# Patient Record
Sex: Female | Born: 1935 | Race: White | Hispanic: No | Marital: Married | Smoking: Former smoker
Health system: Southern US, Community
[De-identification: ages and names within clinical notes are randomized; demographics above are authoritative.]

## PROBLEM LIST (undated history)

## (undated) DIAGNOSIS — E785 Hyperlipidemia, unspecified: Secondary | ICD-10-CM

## (undated) DIAGNOSIS — I10 Essential (primary) hypertension: Secondary | ICD-10-CM

## (undated) HISTORY — PX: ABDOMINAL HYSTERECTOMY: SHX81

## (undated) HISTORY — PX: COLECTOMY: SHX59

## (undated) HISTORY — DX: Hyperlipidemia, unspecified: E78.5

## (undated) HISTORY — DX: Essential (primary) hypertension: I10

---

## 2003-11-12 ENCOUNTER — Other Ambulatory Visit: Payer: Self-pay

## 2004-11-11 ENCOUNTER — Ambulatory Visit: Payer: Self-pay | Admitting: Unknown Physician Specialty

## 2004-12-11 ENCOUNTER — Inpatient Hospital Stay: Payer: Self-pay | Admitting: Surgery

## 2004-12-16 ENCOUNTER — Inpatient Hospital Stay: Payer: Self-pay | Admitting: Surgery

## 2005-01-09 ENCOUNTER — Inpatient Hospital Stay: Payer: Self-pay | Admitting: Surgery

## 2005-02-14 ENCOUNTER — Ambulatory Visit: Payer: Self-pay | Admitting: Surgery

## 2005-02-26 ENCOUNTER — Encounter: Payer: Self-pay | Admitting: Unknown Physician Specialty

## 2006-09-02 ENCOUNTER — Ambulatory Visit: Payer: Self-pay | Admitting: Family Medicine

## 2006-09-28 ENCOUNTER — Ambulatory Visit: Payer: Self-pay | Admitting: Unknown Physician Specialty

## 2007-09-07 ENCOUNTER — Ambulatory Visit: Payer: Self-pay | Admitting: Family Medicine

## 2008-11-09 ENCOUNTER — Ambulatory Visit: Payer: Self-pay | Admitting: Family Medicine

## 2009-05-14 ENCOUNTER — Ambulatory Visit: Payer: Self-pay | Admitting: Family Medicine

## 2009-07-07 ENCOUNTER — Emergency Department: Payer: Self-pay | Admitting: Emergency Medicine

## 2009-11-22 ENCOUNTER — Ambulatory Visit: Payer: Self-pay | Admitting: Unknown Physician Specialty

## 2010-07-22 ENCOUNTER — Ambulatory Visit: Payer: Self-pay | Admitting: Internal Medicine

## 2010-12-31 LAB — HEPATIC FUNCTION PANEL
ALT: 14 U/L (ref 7–35)
Alkaline Phosphatase: 70 U/L (ref 25–125)
Bilirubin, Total: 0.4 mg/dL

## 2010-12-31 LAB — BASIC METABOLIC PANEL
BUN: 12 mg/dL (ref 4–21)
Glucose: 93 mg/dL
Sodium: 142 mmol/L (ref 137–147)

## 2010-12-31 LAB — CBC AND DIFFERENTIAL: Hemoglobin: 13.2 g/dL (ref 12.0–16.0)

## 2011-01-19 LAB — HM MAMMOGRAPHY: HM Mammogram: NORMAL

## 2011-05-07 ENCOUNTER — Encounter: Payer: Self-pay | Admitting: Internal Medicine

## 2011-05-07 DIAGNOSIS — E785 Hyperlipidemia, unspecified: Secondary | ICD-10-CM | POA: Insufficient documentation

## 2011-05-07 DIAGNOSIS — I10 Essential (primary) hypertension: Secondary | ICD-10-CM | POA: Insufficient documentation

## 2011-05-21 ENCOUNTER — Ambulatory Visit (INDEPENDENT_AMBULATORY_CARE_PROVIDER_SITE_OTHER): Payer: 59 | Admitting: Internal Medicine

## 2011-05-21 ENCOUNTER — Encounter: Payer: Self-pay | Admitting: Internal Medicine

## 2011-05-21 DIAGNOSIS — L219 Seborrheic dermatitis, unspecified: Secondary | ICD-10-CM

## 2011-05-21 DIAGNOSIS — E785 Hyperlipidemia, unspecified: Secondary | ICD-10-CM

## 2011-05-21 DIAGNOSIS — R300 Dysuria: Secondary | ICD-10-CM

## 2011-05-21 DIAGNOSIS — L218 Other seborrheic dermatitis: Secondary | ICD-10-CM

## 2011-05-21 DIAGNOSIS — B373 Candidiasis of vulva and vagina: Secondary | ICD-10-CM

## 2011-05-21 DIAGNOSIS — R32 Unspecified urinary incontinence: Secondary | ICD-10-CM | POA: Insufficient documentation

## 2011-05-21 DIAGNOSIS — B3731 Acute candidiasis of vulva and vagina: Secondary | ICD-10-CM

## 2011-05-21 LAB — POCT URINALYSIS DIPSTICK
Blood, UA: NEGATIVE
Ketones, UA: NEGATIVE
Protein, UA: NEGATIVE
Spec Grav, UA: 1.01
pH, UA: 6.5

## 2011-05-21 LAB — COMPREHENSIVE METABOLIC PANEL
Albumin: 4.6 g/dL (ref 3.5–5.2)
BUN: 20 mg/dL (ref 6–23)
Calcium: 9.2 mg/dL (ref 8.4–10.5)
Chloride: 104 mEq/L (ref 96–112)
GFR: 87.6 mL/min (ref >60.00)
Glucose, Bld: 124 mg/dL — ABNORMAL HIGH (ref 70–99)
Potassium: 3.8 mEq/L (ref 3.5–5.1)

## 2011-05-21 MED ORDER — FLUCONAZOLE 100 MG PO TABS: 150.0000 mg | ORAL_TABLET | Freq: Every day | ORAL | Status: AC

## 2011-05-21 MED ORDER — SALICYLIC ACID 3 % EX SHAM: 1.0000 "application " | MEDICATED_SHAMPOO | Freq: Every day | CUTANEOUS | Status: DC

## 2011-05-21 MED ORDER — NYSTATIN 100000 UNIT/GM EX POWD: CUTANEOUS | Status: DC

## 2011-05-21 NOTE — Patient Instructions (Signed)
    Seborrheic Dermatitis Seborrheic dermatitis involves pink or red skin with greasy, flaky scales. This is often found on the scalp, eyebrows, nose, bearded area, and behind or on the ears. It can also occur on the central chest. It often occurs where there are more oil (sebaceous) glands. This condition is also known as dandruff. When this condition affects a baby's scalp, it is called cradle cap. It may come and go for no known reason. It can occur in infancy or old age. CAUSES The cause is unknown. It is not the result of too little moisture or too much oil. In some people, seborrheic dermatitis flare-ups seem to be triggered by stress. It also commonly occurs in people with certain diseases such as Parkinson's disease or HIV/AIDS. SYMPTOMS  Thick scales on the scalp.   Redness on the face or in the armpits.   The skin may seem oily or dry, but moisturizers do not help.   In infants, seborrheic dermatitis appears as scaly redness that does not seem to bother the baby. In some babies, it affects only the scalp. In others, it also affects the neck creases, armpits, groin, or behind the ears.   In adults and adolescents, seborrheic dermatitis may affect only the scalp. It may look patchy or spread out, with areas of redness and flaking. Other areas commonly affected include:   Eyebrows.  Eyelids.   Forehead.   Skin behind the ears.   Outer ears.  Chest.   Armpits.   Nose creases.   Skin creases under the breasts.  Skin between the buttocks.   Groin.    Some adults and adolescents feel itching or burning in the affected areas. Others have no problems.  DIAGNOSIS Your caregiver can usually tell what the problem is by doing a physical exam. TREATMENT  Cortisone (steroid) ointments, creams, and lotions can help decrease inflammation.   Babies can be treated with baby oil to soften the scales, and then washed with baby shampoo. If this does not help, medicated shampoos  should work.   Adults can also use medicated shampoos.   Your caregiver may prescribe corticosteroid cream and shampoo containing an antifungal or yeast medicine (ketoconazole). Hydrocortisone or anti-yeast cream can be rubbed directly into seborrheic dermatitis patches. Yeast does not cause seborrheic dermatitis, but it seems to add to the problem.  In infants, seborrheic dermatitis is often worst during the first year of life. It tends to disappear on its own as the child grows. However, it may return during the teenage years. In adults and adolescents, seborrheic dermatitis tends to be a long-lasting condition that comes and goes over many years. HOME CARE INSTRUCTIONS  Use prescribed medicines as directed.   In infants, do not aggressively remove the scales or flakes on the scalp with a comb or other means. This may lead to hair loss.  SEEK MEDICAL CARE IF:  The problem does not improve from the medicated shampoos, lotions, or other medicines given by your caregiver.   You have any other questions or concerns.  Document Released: 09/08/2005 Document Re-Released: 02/26/2010 [employer] Patient Information 2011 [employer]. 

## 2011-05-21 NOTE — Progress Notes (Signed)
Subjective:    Patient ID: Chelsea Howard, female    DOB: [birth year], 77 y.o.   MRN: [id number]  [name] is a 77 year old female with a history of hypertension and hyperlipidemia who presents for followup. She has several concerns today. First she reports a rash over her scalp which has been present for several weeks. She describes this rash is crusting and itchy. She has not tried any medications for this. She denies any fever, chills, or pain over her scalp.  She also reports recent leakage of urine. She describes this as a constant dripping of urine rather than urge incontinence. She reports that she wears a panty liner or thicker pad but her clothes are often soaked with urine. She reports the urine your takes her skin and she has a rash over her groin area which is extremely itchy. She reports that Dr. Tiburcio Pea had given her a cream to help with this last year with minimal improvement.  She also reports some areas of bulging in her abdomen. She notes she has had several abdominal surgeries. One of her abdominal surgeries involved repair of the large ventral wall hernia with mesh. She now notes some bulging in the area where the mesh was placed. She denies any abdominal pain, discoloration of the abdomen, nausea, vomiting, constipation, or diarrhea.  [name] also presents to followup hypertension. Her hypertension has been ongoing for years and has been previously well controlled. She currently uses quinapril. She has a history of urine positive for microalbumin. Her most recent urine microalbumin however was negative in July of 2012. She did not bring a record of her blood pressure today. She denies chest pain, shortness of breath, palpitations, or lower chimney edema.  Hyperlipidemia This is a chronic problem. The current episode started more than 1 year ago. The problem is controlled. Recent lipid tests were reviewed and are normal (11914). Exacerbating diseases include obesity. She has  no history of chronic renal disease, diabetes, hypothyroidism or liver disease. Factors aggravating her hyperlipidemia include no known factors. Pertinent negatives include no chest pain, leg pain, myalgias or shortness of breath. Current antihyperlipidemic treatment includes statins. The current treatment provides significant improvement of lipids. There are no compliance problems.  Risk factors for coronary artery disease include a sedentary lifestyle, obesity and dyslipidemia.     Outpatient Prescriptions Prior to Visit  Medication Sig Dispense Refill  . aspirin 81 MG tablet Take 81 mg by mouth daily.        . Calcium Carbonate-Vitamin D (CALCIUM + D) 600-200 MG-UNIT TABS Take by mouth.        . quinapril (ACCUPRIL) 10 MG tablet Take 10 mg by mouth 2 (two) times daily.        . simvastatin (ZOCOR) 20 MG tablet Take 20 mg by mouth daily.           Review of Systems  Constitutional: Negative for fever, chills, appetite change, fatigue and unexpected weight change.  HENT: Negative for ear pain, congestion, sore throat, trouble swallowing, neck pain, voice change and sinus pressure.   Eyes: Negative for visual disturbance.  Respiratory: Negative for cough, shortness of breath, wheezing and stridor.   Cardiovascular: Negative for chest pain, palpitations and leg swelling.  Gastrointestinal: Positive for abdominal distention. Negative for nausea, vomiting, abdominal pain, diarrhea, constipation, blood in stool and anal bleeding.  Genitourinary: Positive for frequency (and incontinence). Negative for dysuria and flank pain.  Musculoskeletal: Negative for myalgias, arthralgias and gait problem.  Skin: Positive  for rash (over scalp). Negative for color change.  Neurological: Negative for dizziness and headaches.  Hematological: Negative for adenopathy. Does not bruise/bleed easily.  Psychiatric/Behavioral: Negative for suicidal ideas, sleep disturbance and dysphoric mood. The patient is not  nervous/anxious.      BP 145/79  Pulse 83  Temp(Src) 97.9 F (36.6 C) (Oral)  Resp 16  Ht 5\' 2"  (1.575 m)  Wt 194 lb 12 oz (88.338 kg)  BMI 35.62 kg/m2  SpO2 95%     Objective:   Physical Exam  Constitutional: She is oriented to person, place, and time. She appears well-developed and well-nourished. No distress.  HENT:  Head: Normocephalic and atraumatic.  Right Ear: External ear normal.  Left Ear: External ear normal.  Nose: Nose normal.  Mouth/Throat: Oropharynx is clear and moist. No oropharyngeal exudate.  Eyes: Conjunctivae are normal. Pupils are equal, round, and reactive to light. Right eye exhibits no discharge. Left eye exhibits no discharge. No scleral icterus.  Neck: Normal range of motion. Neck supple. No tracheal deviation present. No thyromegaly present.  Cardiovascular: Normal rate, regular rhythm, normal heart sounds and intact distal pulses.  Exam reveals no gallop and no friction rub.   No murmur heard. Pulmonary/Chest: Effort normal and breath sounds normal. No respiratory distress. She has no wheezes. She has no rales. She exhibits no tenderness.  Abdominal: Soft. Normal appearance and bowel sounds are normal. She exhibits no distension. There is no hepatosplenomegaly. There is no tenderness. A hernia is present. Hernia confirmed positive in the ventral area.         Numerous healed scars over patients abdomen, with several areas of palpable ventral wall defects which are reducible  Genitourinary:    Pelvic exam was performed with patient prone. There is rash on the right labia. There is no tenderness or lesion on the right labia. There is rash on the left labia. There is no tenderness or lesion on the left labia. There is erythema around the vagina. No tenderness around the vagina. No signs of injury around the vagina. No vaginal discharge found.       Left labia enlarged and edematous, diffuse erythematous rash over groin and vulva  Musculoskeletal: Normal  range of motion. She exhibits no edema and no tenderness.  Lymphadenopathy:    She has no cervical adenopathy.  Neurological: She is alert and oriented to person, place, and time. No cranial nerve deficit. She exhibits normal muscle tone. Coordination normal.  Skin: Skin is warm and dry. Rash noted. Rash is papular. She is not diaphoretic. No erythema. No pallor.       Scaling erythematous papular rash over scalp  Psychiatric: She has a normal mood and affect. Her behavior is normal. Judgment and thought content normal.          Assessment & Plan:  1. Urinary incontinence - patient with urinary incontinence. Her exam is remarkable for vulvar edema and vaginal prolapse. Suspect that this is leading to leakage of urine and she may need either pessary or surgical repair. She also complained of dysuria today, however urine dip performed in clinic was negative. Will send urine for culture. Will set her up with the urogynecology at [facility]. She will return to clinic here in one month.   2. Seborrheic Dermatitis -  patient with rash over scalp which is consistent with seborrheic dermatitis. Will try a course of Selsun shampoo. She will apply the shampoo daily for 20 minutes for 7 day course. If she has no  improvement we'll set her up with dermatology for evaluation.   3. Vaginal Candidiasis - patient with vaginal candidiasis. Will treat with Diflucan. Will also use Nystop to help prevent recurrent infection in the vaginal area and groin. Suspect that his problem is recurrent secondary to ongoing moisture from urinary leakage.  4. Hypertension -  patient with long history of hypertension. Blood pressure slightly elevated today. Recent urine microalbumin was negative. Will repeat renal function with labs today. She will followup in one month. His blood pressure consistently elevated above goal of 120/80 we'll consider increasing dose of quinapril.   5. Hyperlipidemia -  patient with history of  hyperlipidemia. Currently on simvastatin. Will check liver function test with labs today. She had recent lipid profile in April 2012 which was excellent.

## 2011-05-23 LAB — URINE CULTURE: Colony Count: 60000

## 2011-05-29 ENCOUNTER — Telehealth (INDEPENDENT_AMBULATORY_CARE_PROVIDER_SITE_OTHER): Payer: 59 | Admitting: *Deleted

## 2011-05-29 ENCOUNTER — Other Ambulatory Visit (INDEPENDENT_AMBULATORY_CARE_PROVIDER_SITE_OTHER): Payer: 59 | Admitting: *Deleted

## 2011-05-29 DIAGNOSIS — N390 Urinary tract infection, site not specified: Secondary | ICD-10-CM

## 2011-05-29 LAB — POCT URINALYSIS DIPSTICK
Bilirubin, UA: NEGATIVE
pH, UA: 6.5

## 2011-05-29 NOTE — Telephone Encounter (Signed)
Patient came in office this morning to get her lab results. I printed out a copy for her. Also I asked her if she has been having any symptoms since the second culture showed multiple growth as well. She said that she has been having a lot of leakage and a lot of burning, and also some itching. She left another specimen. The UA did show some blood ( I put the results in) , so I sent out another culture. She is also asking if you would refer her to a gynecologist.

## 2011-05-29 NOTE — Telephone Encounter (Signed)
We should set her up with urogynecology at [facility].

## 2011-05-29 NOTE — Telephone Encounter (Signed)
Routed note to shannon to setup with urogynecology at [facility].

## 2011-05-29 NOTE — Telephone Encounter (Signed)
Yes, she says that it is for the incontinence.

## 2011-05-29 NOTE — Telephone Encounter (Signed)
Sounds good. I am happy to refer her to a gynecologist.  Just need to know reason (I suspect urinary incontinence).

## 2011-05-31 LAB — URINE CULTURE

## 2011-06-02 ENCOUNTER — Other Ambulatory Visit: Payer: Self-pay | Admitting: *Deleted

## 2011-06-02 MED ORDER — CIPROFLOXACIN HCL 500 MG PO TABS: 500.0000 mg | ORAL_TABLET | Freq: Two times a day (BID) | ORAL | Status: AC

## 2011-06-06 ENCOUNTER — Encounter: Payer: Self-pay | Admitting: Internal Medicine

## 2011-06-23 ENCOUNTER — Ambulatory Visit (INDEPENDENT_AMBULATORY_CARE_PROVIDER_SITE_OTHER): Payer: 59 | Admitting: Internal Medicine

## 2011-06-23 ENCOUNTER — Encounter: Payer: Self-pay | Admitting: Internal Medicine

## 2011-06-23 VITALS — BP 161/83 | HR 84 | Temp 98.3°F | Resp 16 | Ht 62.0 in | Wt 200.5 lb

## 2011-06-23 DIAGNOSIS — N390 Urinary tract infection, site not specified: Secondary | ICD-10-CM

## 2011-06-23 DIAGNOSIS — R32 Unspecified urinary incontinence: Secondary | ICD-10-CM

## 2011-06-23 DIAGNOSIS — I10 Essential (primary) hypertension: Secondary | ICD-10-CM

## 2011-06-23 LAB — POCT URINALYSIS DIPSTICK
Bilirubin, UA: NEGATIVE
Glucose, UA: NEGATIVE
Ketones, UA: NEGATIVE
Nitrite, UA: NEGATIVE
Protein, UA: NEGATIVE
Spec Grav, UA: 1.01
Urobilinogen, UA: 0.2
pH, UA: 7

## 2011-06-23 NOTE — Patient Instructions (Addendum)
We will set up referral to urogynecology. Follow up in 1 month.

## 2011-06-23 NOTE — Progress Notes (Signed)
Subjective:    Patient ID: Chelsea Howard, female    DOB: 01-Oct-1931, 77 y.o.   MRN: [id number]  HPI Chelsea Howard is a 77 year old female with a recent history of urinary incontinence who presents for followup. At her last visit she reported urinary incontinence with some rectal leakage. Her exam was remarkable for erythema over her labia. We'll plan to set her up with urogynecology for evaluation. However, this referral has not taken place. She reports persistent urinary leakage. She was treated with a 14 day course of Cipro for urinary tract infection, however reports no improvement with this. She denies any fever or chills. She denies any flank pain or hematuria.    Outpatient Encounter Prescriptions as of 06/23/2011  Medication Sig Dispense Refill  . aspirin 81 MG tablet Take 81 mg by mouth daily.        . Calcium Carbonate-Vitamin D (CALCIUM + D) 600-200 MG-UNIT TABS Take by mouth.        . nystatin (NYSTOP) 100000 UNIT/GM POWD 1 application twice daily  30 g  0  . quinapril (ACCUPRIL) 10 MG tablet Take 10 mg by mouth 2 (two) times daily.        . Salicylic Acid 3 % SHAM Apply 1 application topically daily.  1 Bottle  0  . simvastatin (ZOCOR) 20 MG tablet Take 20 mg by mouth daily.        . tacrolimus (PROTOPIC) 0.1 % ointment Apply topically as needed.        . triamcinolone (KENALOG) 0.1 % ointment       . zinc oxide (BALMEX) 11.3 % CREA cream Apply topically as needed.          Review of Systems  Constitutional: Negative for fever, chills, diaphoresis and fatigue.  Gastrointestinal: Negative for abdominal pain and blood in stool.  Genitourinary: Positive for dysuria, urgency, frequency and vaginal pain.  Skin: Positive for rash.   BP 161/83  Pulse 84  Temp(Src) 98.3 F (36.8 C) (Oral)  Resp 16  Ht 5\' 2"  (1.575 m)  Wt 200 lb 8 oz (90.946 kg)  BMI 36.67 kg/m2  SpO2 96%     Objective:   Physical Exam  Constitutional: She is oriented to person, place, and time. She  appears well-developed and well-nourished. No distress.  HENT:  Head: Normocephalic and atraumatic.  Right Ear: External ear normal.  Left Ear: External ear normal.  Nose: Nose normal.  Mouth/Throat: Oropharynx is clear and moist. No oropharyngeal exudate.  Eyes: Conjunctivae are normal. Pupils are equal, round, and reactive to light. Right eye exhibits no discharge. Left eye exhibits no discharge. No scleral icterus.  Neck: Normal range of motion. Neck supple. No tracheal deviation present. No thyromegaly present.  Cardiovascular: Normal rate, regular rhythm, normal heart sounds and intact distal pulses.  Exam reveals no gallop and no friction rub.   No murmur heard. Pulmonary/Chest: Effort normal and breath sounds normal. No respiratory distress. She has no wheezes. She has no rales. She exhibits no tenderness.  Musculoskeletal: Normal range of motion. She exhibits no edema and no tenderness.  Lymphadenopathy:    She has no cervical adenopathy.  Neurological: She is alert and oriented to person, place, and time. No cranial nerve deficit. She exhibits normal muscle tone. Coordination normal.  Skin: Skin is warm and dry. No rash noted. She is not diaphoretic. No erythema. No pallor.  Psychiatric: She has a normal mood and affect. Her behavior is normal. Judgment and thought content normal.  Assessment & Plan:  1. Urinary incontinence with rectal leakage - referral made to [facility] urogynecology today. Question if she may have a fistula between her rectum and bladder given persistent leakage of urine through rectum. Her last urine culture showed several different bacteria and was repeated with the same result. She was treated with a two-week course of Cipro without improvement. Her urine today show signs of inflammation. We will send her urine for culture. She will followup in one month after she is seen by urogynecology.  2. Hypertension - BP elevated today. On quinapril. Recent renal  function was normal on recent labs.Recheck 1 month.

## 2011-06-25 LAB — URINE CULTURE: Colony Count: >=100000

## 2011-06-30 ENCOUNTER — Telehealth: Payer: Self-pay | Admitting: *Deleted

## 2011-06-30 NOTE — Telephone Encounter (Signed)
Left message for patient to return my call.

## 2011-06-30 NOTE — Telephone Encounter (Signed)
Patient notified

## 2011-06-30 NOTE — Telephone Encounter (Signed)
I think we should leave her off antibiotics, because the bacteria that showed on culture is typically a colonizing bacteria.  Unless she has progressive symptoms, worsening pain, fever, etc.

## 2011-06-30 NOTE — Telephone Encounter (Signed)
Patient says that she now got her appt with [facility]. It is scheduled for 07-16-11. Also she is asking if you would still want her to repeat antibiotic. If so what do I need to call in. Please advise.

## 2011-07-28 ENCOUNTER — Ambulatory Visit (INDEPENDENT_AMBULATORY_CARE_PROVIDER_SITE_OTHER): Payer: 59 | Admitting: Internal Medicine

## 2011-07-28 ENCOUNTER — Encounter: Payer: Self-pay | Admitting: Internal Medicine

## 2011-07-28 DIAGNOSIS — I10 Essential (primary) hypertension: Secondary | ICD-10-CM

## 2011-07-28 DIAGNOSIS — B373 Candidiasis of vulva and vagina: Secondary | ICD-10-CM

## 2011-07-28 NOTE — Progress Notes (Signed)
Subjective:    Patient ID: Chelsea Howard, female    DOB: [birth year], 77 y.o.   MRN: [id number]  HPI 77 year old female with a history of hypertension and urinary issues who presents for followup. Patient reports that she was seen by urogynecology. She notes that she was started on a topical triamcinolone and nystatin cream. She was also instructed to stop using panty liners because there was concern about allergy to the adhesive and knees. She reports significant improvement in her symptoms of vaginal burning or itching. She reports no ongoing urinary leakage at this point.  In regards to her hypertension, she reports full compliance with her medications. She has not been regularly checking her blood pressure.  Outpatient Encounter Prescriptions as of 07/28/2011  Medication Sig Dispense Refill  . aspirin 81 MG tablet Take 81 mg by mouth daily.        . Calcium Carbonate-Vitamin D (CALCIUM + D) 600-200 MG-UNIT TABS Take by mouth.        . nystatin-triamcinolone (MYCOLOG) ointment Apply topically 2 (two) times daily.        . quinapril (ACCUPRIL) 10 MG tablet Take 10 mg by mouth 2 (two) times daily.        . simvastatin (ZOCOR) 20 MG tablet Take 20 mg by mouth daily.          Review of Systems  Constitutional: Negative for fever, chills, appetite change, fatigue and unexpected weight change.  HENT: Negative for ear pain, congestion, sore throat, trouble swallowing, neck pain, voice change and sinus pressure.   Eyes: Negative for visual disturbance.  Respiratory: Negative for cough, shortness of breath, wheezing and stridor.   Cardiovascular: Negative for chest pain, palpitations and leg swelling.  Gastrointestinal: Negative for nausea, vomiting, abdominal pain, diarrhea, constipation, blood in stool, abdominal distention and anal bleeding.  Genitourinary: Negative for dysuria, urgency, frequency, flank pain, vaginal discharge, vaginal pain and pelvic pain.  Musculoskeletal: Negative for  myalgias, arthralgias and gait problem.  Skin: Negative for color change and rash.  Neurological: Negative for dizziness and headaches.  Hematological: Negative for adenopathy. Does not bruise/bleed easily.  Psychiatric/Behavioral: Negative for suicidal ideas, sleep disturbance and dysphoric mood. The patient is not nervous/anxious.    BP 150/80  Pulse 86  Temp(Src) 97.7 F (36.5 C) (Oral)  Wt 196 lb (88.905 kg)  SpO2 96%     Objective:   Physical Exam  Constitutional: She is oriented to person, place, and time. She appears well-developed and well-nourished. No distress.  HENT:  Head: Normocephalic and atraumatic.  Right Ear: External ear normal.  Left Ear: External ear normal.  Nose: Nose normal.  Mouth/Throat: Oropharynx is clear and moist. No oropharyngeal exudate.  Eyes: Conjunctivae are normal. Pupils are equal, round, and reactive to light. Right eye exhibits no discharge. Left eye exhibits no discharge. No scleral icterus.  Neck: Normal range of motion. Neck supple. No tracheal deviation present. No thyromegaly present.  Cardiovascular: Normal rate, regular rhythm, normal heart sounds and intact distal pulses.  Exam reveals no gallop and no friction rub.   No murmur heard. Pulmonary/Chest: Effort normal and breath sounds normal. No respiratory distress. She has no wheezes. She has no rales. She exhibits no tenderness.  Musculoskeletal: Normal range of motion. She exhibits no edema and no tenderness.  Lymphadenopathy:    She has no cervical adenopathy.  Neurological: She is alert and oriented to person, place, and time. No cranial nerve deficit. She exhibits normal muscle tone. Coordination normal.  Skin:  Skin is warm and dry. No rash noted. She is not diaphoretic. No erythema. No pallor.  Psychiatric: She has a normal mood and affect. Her behavior is normal. Judgment and thought content normal.          Assessment & Plan:  1. Vaginitis -symptoms markedly improved with  triamcinolone and nystatin. We'll continue to monitor. We'll request records from her urogynecology evaluation.  2. Hypertension -blood pressure is slightly elevated today. Patient will check blood pressure at home 1-2 times per week. She will call if blood pressure consistently greater than 140/90. Otherwise, will plan have her followup in April 2013.

## 2011-08-07 ENCOUNTER — Ambulatory Visit: Payer: Self-pay | Admitting: Internal Medicine

## 2011-08-27 ENCOUNTER — Encounter: Payer: Self-pay | Admitting: Internal Medicine

## 2012-01-19 ENCOUNTER — Ambulatory Visit (INDEPENDENT_AMBULATORY_CARE_PROVIDER_SITE_OTHER): Payer: 59 | Admitting: Internal Medicine

## 2012-01-19 ENCOUNTER — Encounter: Payer: Self-pay | Admitting: Internal Medicine

## 2012-01-19 VITALS — BP 138/78 | HR 80 | Temp 97.6°F | Resp 16 | Ht 62.75 in | Wt 194.8 lb

## 2012-01-19 DIAGNOSIS — E785 Hyperlipidemia, unspecified: Secondary | ICD-10-CM | POA: Insufficient documentation

## 2012-01-19 DIAGNOSIS — I10 Essential (primary) hypertension: Secondary | ICD-10-CM

## 2012-01-19 DIAGNOSIS — Z0000 Encounter for general adult medical examination without abnormal findings: Secondary | ICD-10-CM

## 2012-01-19 LAB — COMPREHENSIVE METABOLIC PANEL
Albumin: 4.2 g/dL (ref 3.5–5.2)
Alkaline Phosphatase: 65 U/L (ref 39–117)
BUN: 9 mg/dL (ref 6–23)
Calcium: 9.1 mg/dL (ref 8.4–10.5)
Chloride: 103 mEq/L (ref 96–112)
Glucose, Bld: 94 mg/dL (ref 70–99)
Potassium: 3.7 mEq/L (ref 3.5–5.1)

## 2012-01-19 LAB — LIPID PANEL
Cholesterol: 145 mg/dL (ref 0–200)
Triglycerides: 183 mg/dL — ABNORMAL HIGH (ref 0.0–149.0)

## 2012-01-19 MED ORDER — QUINAPRIL HCL 10 MG PO TABS: 10.0000 mg | ORAL_TABLET | Freq: Two times a day (BID) | ORAL | Status: DC

## 2012-01-19 MED ORDER — SIMVASTATIN 20 MG PO TABS: 20.0000 mg | ORAL_TABLET | Freq: Every day | ORAL | Status: DC

## 2012-01-19 NOTE — Assessment & Plan Note (Signed)
BP well controlled. Continue Quinapril. Will check renal function with labs today. Follow up 6 months.

## 2012-01-19 NOTE — Assessment & Plan Note (Signed)
Will repeat CMP and lipids today. Goal LDL<100. Continue Simvastatin. Follow up 6 months.

## 2012-01-19 NOTE — Assessment & Plan Note (Signed)
Health maintenance is UTD.  Continue current medications. Encouraged continued efforts at healthy diet and exercise. Follow up 6 months.

## 2012-01-19 NOTE — Progress Notes (Signed)
Patient ID: Chelsea Howard, female   DOB: [birth year], 78 y.o.   MRN: [id number] Subjective:    Chelsea Howard is a 78 y.o. female who presents for Medicare Annual/Subsequent preventive examination. She is doing well and has no concerns today.  Preventive Screening-Counseling & Management  Tobacco History  Smoking status  . Former Smoker   Smokeless tobacco  . Never Used       Current Problems (verified) Patient Active Problem List  Diagnoses  . Hyperlipidemia  . Hypertension  . Urinary incontinence    Medications Prior to Visit Current Outpatient Prescriptions on File Prior to Visit  Medication Sig Dispense Refill  . aspirin 81 MG tablet Take 81 mg by mouth daily.        . Calcium Carbonate-Vitamin D (CALCIUM + D) 600-200 MG-UNIT TABS Take by mouth.        . nystatin-triamcinolone (MYCOLOG) ointment Apply topically 2 (two) times daily.        . quinapril (ACCUPRIL) 10 MG tablet Take 10 mg by mouth 2 (two) times daily.        . simvastatin (ZOCOR) 20 MG tablet Take 20 mg by mouth daily.          Current Medications (verified) Current Outpatient Prescriptions  Medication Sig Dispense Refill  . aspirin 81 MG tablet Take 81 mg by mouth daily.        . Calcium Carbonate-Vitamin D (CALCIUM + D) 600-200 MG-UNIT TABS Take by mouth.        . NON FORMULARY Take 1 each by mouth daily. Yeast Guard.      . nystatin-triamcinolone (MYCOLOG) ointment Apply topically 2 (two) times daily.        . quinapril (ACCUPRIL) 10 MG tablet Take 10 mg by mouth 2 (two) times daily.        . simvastatin (ZOCOR) 20 MG tablet Take 20 mg by mouth daily.           Allergies (verified) Review of patient's allergies indicates no known allergies.   PAST HISTORY  Family History Father - Heart disease Mother - Hypertension Brother - Diabetes  Social History History  Substance Use Topics  . Smoking status: Former Games developer   . Smokeless tobacco: Never Used  . Alcohol Use: No     Are there  smokers in your home (other than you)? No  Risk Factors Current exercise habits: Home exercise routine includes walking.  Dietary issues discussed: Healthy diet   Cardiac risk factors: advanced age (older than 76 for men, 87 for women).  Depression Screen (Note: if answer to either of the following is "Yes", a more complete depression screening is indicated)   Over the past two weeks, have you felt down, depressed or hopeless? No  Over the past two weeks, have you felt little interest or pleasure in doing things? No  Have you lost interest or pleasure in daily life? No  Do you often feel hopeless? No  Do you cry easily over simple problems? No  Activities of Daily Living In your present state of health, do you have any difficulty performing the following activities?:  Driving? No Managing money?  No Feeding yourself? No Getting from bed to chair? No Climbing a flight of stairs? No Preparing food and eating?: No Bathing or showering? No Getting dressed: No Getting to the toilet? No Using the toilet:No Moving around from place to place: No In the past year have you fallen or had a near fall?:No  Are you sexually active?  Yes  Do you have more than one partner?  No  Hearing Difficulties: No Do you often ask people to speak up or repeat themselves? No Do you experience ringing or noises in your ears? No Do you have difficulty understanding soft or whispered voices? No   Do you feel that you have a problem with memory? No  Do you often misplace items? No  Do you feel safe at home?  Yes  Cognitive Testing  Alert? Yes  Normal Appearance?Yes  Oriented to person? Yes  Place? Yes   Time? Yes  Recall of three objects?  Yes  Can perform simple calculations? Yes  Displays appropriate judgment?Yes  Can read the correct time from a watch face?Yes   Advanced Directives have been discussed with the patient? Yes, HCPOA is husband  List the Names of Other Physician/Practitioners  you currently use: 1.  Dr. Donnetta Hutching - optho    There is no immunization history on file for this patient.  Screening Tests Health Maintenance  Topic Date Due  . Tetanus/tdap  01/04/1953  . Colonoscopy  01/05/1984  . Zostavax  01/04/1994  . Pneumococcal Polysaccharide Vaccine Age 43 And Over  01/05/1999  . Influenza Vaccine  06/22/2012    All answers were reviewed with the patient and necessary referrals were made:  Shelia Media, MD   01/19/2012   History reviewed: allergies, current medications, past family history, past medical history, past social history, past surgical history and problem list  Review of Systems A comprehensive review of systems was negative.    Objective:      Body mass index is 34.77 kg/(m^2). BP 138/78  Pulse 80  Temp(Src) 97.6 F (36.4 C) (Oral)  Resp 16  Ht 5' 2.75" (1.594 m)  Wt 194 lb 12 oz (88.338 kg)  BMI 34.77 kg/m2   General Appearance:    Alert, cooperative, no distress, appears stated age  Head:    Normocephalic, without obvious abnormality, atraumatic  Eyes:    PERRL, conjunctiva/corneas clear, EOM's intact, fundi    benign, both eyes  Ears:    Normal TM's and external ear canals, both ears  Nose:   Nares normal, septum midline, mucosa normal, no drainage    or sinus tenderness  Throat:   Lips, mucosa, and tongue normal; teeth and gums normal  Neck:   Supple, symmetrical, trachea midline, no adenopathy;    thyroid:  no enlargement/tenderness/nodules; no carotid   bruit or JVD  Back:     Symmetric, no curvature, ROM normal, no CVA tenderness  Lungs:     Clear to auscultation bilaterally, respirations unlabored  Chest Wall:    No tenderness or deformity   Heart:    Regular rate and rhythm, S1 and S2 normal, no murmur, rub   or gallop  Breast Exam:    No tenderness, masses, or nipple abnormality  Abdomen:     Soft, non-tender, bowel sounds active all four quadrants,    no masses, no organomegaly  Extremities:   Extremities normal,  atraumatic, no cyanosis or edema  Pulses:   2+ and symmetric all extremities  Skin:   Skin color, texture, turgor normal, no rashes or lesions  Lymph nodes:   Cervical, supraclavicular, and axillary nodes normal  Neurologic:   CNII-XII intact, normal strength, sensation and reflexes    throughout       Assessment:     78YO female  presents for annual wellness exam. She is doing very well.  Health maintenance is up to date with the exception of tetanus vaccine, which is not covered by Medicare so she will defer for now.  She is followed by opthalmology, so vision screen deferred.     Plan:     Continue current medications. Continue with efforts at healthy diet and regular physical activity with goal of of aerobic exercise most days of week. Patient Instructions (the written plan) was given to the patient.  Medicare Attestation I have personally reviewed: The patient's medical and social history Their use of alcohol, tobacco or illicit drugs Their current medications and supplements The patient's functional ability including ADLs,fall risks, home safety risks, cognitive, and hearing and visual impairment Diet and physical activities Evidence for depression or mood disorders  The patient's weight, height, BMI, and visual acuity have been recorded in the chart.  I have made referrals, counseling, and provided education to the patient based on review of the above and I have provided the patient with a written personalized care plan for preventive services.     Shelia Media, MD   01/19/2012

## 2012-03-02 ENCOUNTER — Other Ambulatory Visit: Payer: Self-pay | Admitting: *Deleted

## 2012-03-02 DIAGNOSIS — I10 Essential (primary) hypertension: Secondary | ICD-10-CM

## 2012-03-02 MED ORDER — QUINAPRIL HCL 10 MG PO TABS: 10.0000 mg | ORAL_TABLET | Freq: Two times a day (BID) | ORAL | Status: DC

## 2012-07-20 ENCOUNTER — Ambulatory Visit: Payer: 59 | Admitting: Internal Medicine

## 2012-07-27 ENCOUNTER — Encounter: Payer: Self-pay | Admitting: Internal Medicine

## 2012-07-27 ENCOUNTER — Ambulatory Visit (INDEPENDENT_AMBULATORY_CARE_PROVIDER_SITE_OTHER): Payer: 59 | Admitting: Internal Medicine

## 2012-07-27 VITALS — BP 160/90 | HR 80 | Temp 98.1°F | Ht 62.75 in | Wt 189.5 lb

## 2012-07-27 DIAGNOSIS — Z1239 Encounter for other screening for malignant neoplasm of breast: Secondary | ICD-10-CM

## 2012-07-27 DIAGNOSIS — I10 Essential (primary) hypertension: Secondary | ICD-10-CM

## 2012-07-27 DIAGNOSIS — R413 Other amnesia: Secondary | ICD-10-CM

## 2012-07-27 DIAGNOSIS — E785 Hyperlipidemia, unspecified: Secondary | ICD-10-CM

## 2012-07-27 MED ORDER — SIMVASTATIN 20 MG PO TABS: 20.0000 mg | ORAL_TABLET | Freq: Every day | ORAL | Status: DC

## 2012-07-27 MED ORDER — QUINAPRIL HCL 10 MG PO TABS: 10.0000 mg | ORAL_TABLET | Freq: Two times a day (BID) | ORAL | Status: DC

## 2012-07-27 NOTE — Progress Notes (Signed)
Subjective:    Patient ID: Chelsea Howard, female    DOB: April 13, 1932, 78 y.o.   MRN: [id number]  HPI 78 year old female with history of hypertension and hyperlipidemia presents for followup. Her primary concern today is gradually memory loss over the last several months. She reports that she finds herself to be more forgetful, often forgetting why she has walked into a room or why she is performing a task. She denies any difficulty with driving, or managing basic finances. She has heard about medications to help with memory and is wondering if these might be helpful for her.  In regards to blood pressure, she reports blood pressures been well controlled with quinapril. She denies any chest pain, palpitations, headache. She reports full compliance with her medication. She also reports full compliance with cholesterol medication. She denies any side effects from this medicine.   Outpatient Encounter Prescriptions as of 07/27/2012  Medication Sig Dispense Refill  . aspirin 81 MG tablet Take 81 mg by mouth daily.        . Calcium Carbonate-Vitamin D (CALCIUM + D) 600-200 MG-UNIT TABS Take by mouth.        . NON FORMULARY Take 1 each by mouth daily. Yeast Guard.      . nystatin-triamcinolone (MYCOLOG) ointment Apply topically 2 (two) times daily.        . quinapril (ACCUPRIL) 10 MG tablet Take 1 tablet (10 mg total) by mouth 2 (two) times daily.  60 tablet  11  . simvastatin (ZOCOR) 20 MG tablet Take 1 tablet (20 mg total) by mouth daily.  30 tablet  11   BP 160/90  Pulse 80  Temp 98.1 F (36.7 C) (Oral)  Ht 5' 2.75" (1.594 m)  Wt 189 lb 8 oz (85.957 kg)  BMI 33.84 kg/m2  SpO2 97%  Review of Systems  Constitutional: Negative for fever, chills, appetite change, fatigue and unexpected weight change.  HENT: Negative for ear pain, congestion, sore throat, trouble swallowing, neck pain, voice change and sinus pressure.   Eyes: Negative for visual disturbance.  Respiratory: Negative for cough,  shortness of breath, wheezing and stridor.   Cardiovascular: Negative for chest pain, palpitations and leg swelling.  Gastrointestinal: Negative for nausea, vomiting, abdominal pain, diarrhea, constipation, blood in stool, abdominal distention and anal bleeding.  Genitourinary: Negative for dysuria and flank pain.  Musculoskeletal: Negative for myalgias, arthralgias and gait problem.  Skin: Negative for color change and rash.  Neurological: Negative for dizziness and headaches.  Hematological: Negative for adenopathy. Does not bruise/bleed easily.  Psychiatric/Behavioral: Positive for confusion and decreased concentration. Negative for suicidal ideas, sleep disturbance and dysphoric mood. The patient is not nervous/anxious.        Objective:   Physical Exam  Constitutional: She is oriented to person, place, and time. She appears well-developed and well-nourished. No distress.  HENT:  Head: Normocephalic and atraumatic.  Right Ear: External ear normal.  Left Ear: External ear normal.  Nose: Nose normal.  Mouth/Throat: Oropharynx is clear and moist. No oropharyngeal exudate.  Eyes: Conjunctivae normal are normal. Pupils are equal, round, and reactive to light. Right eye exhibits no discharge. Left eye exhibits no discharge. No scleral icterus.  Neck: Normal range of motion. Neck supple. No tracheal deviation present. No thyromegaly present.  Cardiovascular: Normal rate, regular rhythm, normal heart sounds and intact distal pulses.  Exam reveals no gallop and no friction rub.   No murmur heard. Pulmonary/Chest: Effort normal and breath sounds normal. No respiratory distress. She has  no wheezes. She has no rales. She exhibits no tenderness.  Musculoskeletal: Normal range of motion. She exhibits no edema and no tenderness.  Lymphadenopathy:    She has no cervical adenopathy.  Neurological: She is alert and oriented to person, place, and time. No cranial nerve deficit. She exhibits normal  muscle tone. Coordination normal.  Skin: Skin is warm and dry. No rash noted. She is not diaphoretic. No erythema. No pallor.  Psychiatric: She has a normal mood and affect. Her behavior is normal. Judgment and thought content normal.          Assessment & Plan:

## 2012-07-27 NOTE — Assessment & Plan Note (Signed)
Patient is concerned about progressive memory loss. General exam is normal. Will set up a more detailed cognitive testing. Will also check TSH and B12 with labs today. We discussed potentially starting medications to help slow progression of memory loss, based on results of cognitive testing. Followup one month.

## 2012-07-27 NOTE — Assessment & Plan Note (Signed)
Mammogram ordered today 

## 2012-07-27 NOTE — Assessment & Plan Note (Signed)
Will check LFTs with labs today. Continue simvastatin. 

## 2012-07-27 NOTE — Assessment & Plan Note (Signed)
Blood pressure slightly elevated today but has been well controlled at home. We'll continue to monitor. Patient will call if blood pressure greater than 140/90 consistently. Followup one month.

## 2012-07-30 LAB — COMPREHENSIVE METABOLIC PANEL
BUN: 13 mg/dL (ref 6–23)
CO2: 27 mEq/L (ref 19–32)
Calcium: 9.3 mg/dL (ref 8.4–10.5)
Chloride: 102 mEq/L (ref 96–112)
Creatinine, Ser: 0.7 mg/dL (ref 0.4–1.2)
GFR: 84.49 mL/min (ref >60.00)
Glucose, Bld: 74 mg/dL (ref 70–99)

## 2012-07-30 LAB — TSH: TSH: 2.24 u[IU]/mL (ref 0.35–5.50)

## 2012-07-30 LAB — VITAMIN B12: Vitamin B-12: 497 pg/mL (ref 211–911)

## 2012-08-17 ENCOUNTER — Emergency Department: Payer: Self-pay | Admitting: Emergency Medicine

## 2012-08-17 LAB — CBC
HCT: 48.6 % — ABNORMAL HIGH (ref 35.0–47.0)
MCH: 31.2 pg (ref 26.0–34.0)
MCHC: 33.8 g/dL (ref 32.0–36.0)
Platelet: 415 10*3/uL (ref 150–440)
RBC: 5.26 10*6/uL — ABNORMAL HIGH (ref 3.80–5.20)
RDW: 13.7 % (ref 11.5–14.5)
WBC: 7.4 10*3/uL (ref 3.6–11.0)

## 2012-08-17 LAB — URINALYSIS, COMPLETE
Bacteria: NONE SEEN
Bilirubin,UR: NEGATIVE
Blood: NEGATIVE
Glucose,UR: NEGATIVE mg/dL (ref 0–75)
Leukocyte Esterase: NEGATIVE
Nitrite: NEGATIVE
Specific Gravity: 1.004 (ref 1.003–1.030)
Squamous Epithelial: <1
WBC UR: <1 /HPF (ref 0–5)

## 2012-08-17 LAB — COMPREHENSIVE METABOLIC PANEL
Albumin: 3.8 g/dL (ref 3.4–5.0)
Alkaline Phosphatase: 110 U/L (ref 50–136)
Anion Gap: 7 (ref 7–16)
Bilirubin,Total: 0.8 mg/dL (ref 0.2–1.0)
Calcium, Total: 9.4 mg/dL (ref 8.5–10.1)
Chloride: 104 mmol/L (ref 98–107)
Glucose: 119 mg/dL — ABNORMAL HIGH (ref 65–99)
Osmolality: 275 (ref 275–301)
Potassium: 4.1 mmol/L (ref 3.5–5.1)
Sodium: 138 mmol/L (ref 136–145)

## 2012-08-17 LAB — LIPASE, BLOOD: Lipase: 130 U/L (ref 73–393)

## 2012-08-22 ENCOUNTER — Ambulatory Visit: Payer: Self-pay | Admitting: Internal Medicine

## 2012-08-26 ENCOUNTER — Ambulatory Visit: Payer: Self-pay | Admitting: Internal Medicine

## 2012-08-26 LAB — HEPATIC FUNCTION PANEL A (ARMC)
Alkaline Phosphatase: 119 U/L (ref 50–136)
Bilirubin,Total: 0.5 mg/dL (ref 0.2–1.0)
SGOT(AST): 82 U/L — ABNORMAL HIGH (ref 15–37)
SGPT (ALT): 47 U/L (ref 12–78)

## 2012-08-26 LAB — CBC CANCER CENTER
Basophil %: 0.8 %
Eosinophil %: 1 %
HGB: 15.9 g/dL (ref 12.0–16.0)
Lymphocyte #: 1.6 x10 3/mm (ref 1.0–3.6)
MCH: 30.7 pg (ref 26.0–34.0)
MCV: 91 fL (ref 80–100)
Monocyte #: 0.6 x10 3/mm (ref 0.2–0.9)
Neutrophil %: 67.3 %
RBC: 5.18 10*6/uL (ref 3.80–5.20)

## 2012-08-26 LAB — PROTIME-INR
INR: 0.8
Prothrombin Time: 11.9 secs (ref 11.5–14.7)

## 2012-08-30 LAB — CEA: CEA: 3.3 ng/mL (ref 0.0–4.7)

## 2012-08-30 LAB — CANCER ANTIGEN 19-9: CA 19-9: 41 U/mL — ABNORMAL HIGH (ref 0–35)

## 2012-08-30 LAB — AFP TUMOR MARKER

## 2012-09-02 ENCOUNTER — Ambulatory Visit: Payer: Self-pay | Admitting: Internal Medicine

## 2012-09-10 ENCOUNTER — Ambulatory Visit: Payer: Self-pay | Admitting: Internal Medicine

## 2012-09-10 LAB — PLATELET COUNT: Platelet: 456 10*3/uL — ABNORMAL HIGH (ref 150–440)

## 2012-09-10 LAB — PROTIME-INR: INR: 0.8

## 2012-09-22 ENCOUNTER — Ambulatory Visit: Payer: Self-pay | Admitting: Internal Medicine

## 2012-10-08 LAB — CBC CANCER CENTER
Basophil #: 0.1 x10 3/mm (ref 0.0–0.1)
Basophil %: 0.7 %
Eosinophil %: 0.2 %
HCT: 53.3 % — ABNORMAL HIGH (ref 35.0–47.0)
HGB: 17.7 g/dL — ABNORMAL HIGH (ref 12.0–16.0)
Lymphocyte #: 1.9 x10 3/mm (ref 1.0–3.6)
Lymphocyte %: 18.8 %
MCH: 27.5 pg (ref 26.0–34.0)
MCHC: 33.2 g/dL (ref 32.0–36.0)
Monocyte #: 0.6 x10 3/mm (ref 0.2–0.9)
Monocyte %: 5.8 %
Neutrophil #: 7.6 x10 3/mm — ABNORMAL HIGH (ref 1.4–6.5)
Neutrophil %: 74.5 %
Platelet: 488 x10 3/mm — ABNORMAL HIGH (ref 150–440)
RBC: 6.43 10*6/uL — ABNORMAL HIGH (ref 3.80–5.20)
RDW: 15.2 % — ABNORMAL HIGH (ref 11.5–14.5)
WBC: 10.2 x10 3/mm (ref 3.6–11.0)

## 2012-10-08 LAB — HEPATIC FUNCTION PANEL A (ARMC)
Albumin: 3.1 g/dL — ABNORMAL LOW (ref 3.4–5.0)
Bilirubin,Total: 1.4 mg/dL — ABNORMAL HIGH (ref 0.2–1.0)
SGOT(AST): 153 U/L — ABNORMAL HIGH (ref 15–37)
Total Protein: 7.9 g/dL (ref 6.4–8.2)

## 2012-10-08 LAB — BASIC METABOLIC PANEL
Anion Gap: 16 (ref 7–16)
BUN: 11 mg/dL (ref 7–18)
Chloride: 94 mmol/L — ABNORMAL LOW (ref 98–107)
Creatinine: 0.8 mg/dL (ref 0.60–1.30)
EGFR (Non-African Amer.): >60
Glucose: 130 mg/dL — ABNORMAL HIGH (ref 65–99)
Osmolality: 264 (ref 275–301)
Potassium: 3.7 mmol/L (ref 3.5–5.1)
Sodium: 131 mmol/L — ABNORMAL LOW (ref 136–145)

## 2012-10-08 LAB — MAGNESIUM: Magnesium: 1.8 mg/dL

## 2012-10-11 LAB — HEPATIC FUNCTION PANEL A (ARMC)
Albumin: 2.6 g/dL — ABNORMAL LOW (ref 3.4–5.0)
Alkaline Phosphatase: 120 U/L (ref 50–136)
Bilirubin,Total: 0.8 mg/dL (ref 0.2–1.0)
SGOT(AST): 79 U/L — ABNORMAL HIGH (ref 15–37)

## 2012-10-11 LAB — CANCER CENTER HEMATOCRIT: HCT: 49.1 % — ABNORMAL HIGH (ref 35.0–47.0)

## 2012-10-18 LAB — CBC CANCER CENTER
Basophil #: 0.1 x10 3/mm (ref 0.0–0.1)
Basophil %: 1 %
Eosinophil #: 0.2 x10 3/mm (ref 0.0–0.7)
HCT: 49.5 % — ABNORMAL HIGH (ref 35.0–47.0)
Lymphocyte #: 1.9 x10 3/mm (ref 1.0–3.6)
MCH: 26.8 pg (ref 26.0–34.0)
MCV: 82 fL (ref 80–100)
Monocyte #: 0.4 x10 3/mm (ref 0.2–0.9)
Monocyte %: 5.1 %
Neutrophil #: 5.2 x10 3/mm (ref 1.4–6.5)
Neutrophil %: 67.1 %
Platelet: 441 x10 3/mm — ABNORMAL HIGH (ref 150–440)
RBC: 6.04 10*6/uL — ABNORMAL HIGH (ref 3.80–5.20)

## 2012-10-18 LAB — COMPREHENSIVE METABOLIC PANEL
Albumin: 2.7 g/dL — ABNORMAL LOW (ref 3.4–5.0)
Anion Gap: 8 (ref 7–16)
BUN: 7 mg/dL (ref 7–18)
Bilirubin,Total: 0.6 mg/dL (ref 0.2–1.0)
Calcium, Total: 8.8 mg/dL (ref 8.5–10.1)
Chloride: 98 mmol/L (ref 98–107)
Co2: 29 mmol/L (ref 21–32)
Creatinine: 0.82 mg/dL (ref 0.60–1.30)
EGFR (Non-African Amer.): >60
Osmolality: 272 (ref 275–301)
Potassium: 3.7 mmol/L (ref 3.5–5.1)
SGOT(AST): 53 U/L — ABNORMAL HIGH (ref 15–37)
Sodium: 135 mmol/L — ABNORMAL LOW (ref 136–145)

## 2012-10-22 LAB — HEPATIC FUNCTION PANEL A (ARMC)
Alkaline Phosphatase: 133 U/L (ref 50–136)
Bilirubin, Direct: 0.2 mg/dL (ref 0.00–0.20)
Bilirubin,Total: 0.6 mg/dL (ref 0.2–1.0)

## 2012-10-22 LAB — CANCER CENTER HEMATOCRIT: HCT: 49.4 % — ABNORMAL HIGH (ref 35.0–47.0)

## 2012-10-23 ENCOUNTER — Ambulatory Visit: Payer: Self-pay | Admitting: Internal Medicine

## 2012-10-25 LAB — HEPATIC FUNCTION PANEL A (ARMC)
Albumin: 2.6 g/dL — ABNORMAL LOW (ref 3.4–5.0)
Alkaline Phosphatase: 151 U/L — ABNORMAL HIGH (ref 50–136)
SGOT(AST): 89 U/L — ABNORMAL HIGH (ref 15–37)
Total Protein: 7.7 g/dL (ref 6.4–8.2)

## 2012-11-01 LAB — CREATININE, SERUM
Creatinine: 0.87 mg/dL (ref 0.60–1.30)
EGFR (African American): >60

## 2012-11-01 LAB — CBC CANCER CENTER
Basophil #: 0.1 x10 3/mm (ref 0.0–0.1)
HCT: 51.1 % — ABNORMAL HIGH (ref 35.0–47.0)
HGB: 16.6 g/dL — ABNORMAL HIGH (ref 12.0–16.0)
Lymphocyte #: 1.5 x10 3/mm (ref 1.0–3.6)
Lymphocyte %: 19.2 %
MCHC: 32.6 g/dL (ref 32.0–36.0)
MCV: 81 fL (ref 80–100)
Monocyte #: 0.3 x10 3/mm (ref 0.2–0.9)
Neutrophil #: 5.9 x10 3/mm (ref 1.4–6.5)
Neutrophil %: 73.7 %
Platelet: 435 x10 3/mm (ref 150–440)
RBC: 6.31 10*6/uL — ABNORMAL HIGH (ref 3.80–5.20)
RDW: 16.4 % — ABNORMAL HIGH (ref 11.5–14.5)

## 2012-11-01 LAB — HEPATIC FUNCTION PANEL A (ARMC)
Albumin: 2.7 g/dL — ABNORMAL LOW (ref 3.4–5.0)
Bilirubin, Direct: 0.2 mg/dL (ref 0.00–0.20)
SGOT(AST): 74 U/L — ABNORMAL HIGH (ref 15–37)
SGPT (ALT): 32 U/L (ref 12–78)
Total Protein: 7.7 g/dL (ref 6.4–8.2)

## 2012-11-06 ENCOUNTER — Other Ambulatory Visit: Payer: Self-pay

## 2012-11-08 LAB — HEPATIC FUNCTION PANEL A (ARMC)
Albumin: 2.8 g/dL — ABNORMAL LOW (ref 3.4–5.0)
Bilirubin,Total: 0.7 mg/dL (ref 0.2–1.0)
SGOT(AST): 112 U/L — ABNORMAL HIGH (ref 15–37)
SGPT (ALT): 36 U/L (ref 12–78)
Total Protein: 8.1 g/dL (ref 6.4–8.2)

## 2012-11-12 LAB — HEPATIC FUNCTION PANEL A (ARMC)
Albumin: 2.7 g/dL — ABNORMAL LOW (ref 3.4–5.0)
Bilirubin, Direct: 0.2 mg/dL (ref 0.00–0.20)
Bilirubin,Total: 0.6 mg/dL (ref 0.2–1.0)
SGOT(AST): 99 U/L — ABNORMAL HIGH (ref 15–37)
SGPT (ALT): 49 U/L (ref 12–78)
Total Protein: 8 g/dL (ref 6.4–8.2)

## 2012-11-15 LAB — HEPATIC FUNCTION PANEL A (ARMC)
Bilirubin, Direct: 0.2 mg/dL (ref 0.00–0.20)
Bilirubin,Total: 0.5 mg/dL (ref 0.2–1.0)
SGOT(AST): 93 U/L — ABNORMAL HIGH (ref 15–37)
Total Protein: 8.1 g/dL (ref 6.4–8.2)

## 2012-11-20 ENCOUNTER — Ambulatory Visit: Payer: Self-pay | Admitting: Internal Medicine

## 2012-11-24 LAB — CBC CANCER CENTER
Eosinophil #: 0.1 x10 3/mm (ref 0.0–0.7)
Eosinophil %: 1.3 %
HCT: 50.2 % — ABNORMAL HIGH (ref 35.0–47.0)
HGB: 16.2 g/dL — ABNORMAL HIGH (ref 12.0–16.0)
Lymphocyte %: 23.4 %
MCH: 25.6 pg — ABNORMAL LOW (ref 26.0–34.0)
MCHC: 32.3 g/dL (ref 32.0–36.0)
Neutrophil #: 6 x10 3/mm (ref 1.4–6.5)
Neutrophil %: 69.8 %
RBC: 6.33 10*6/uL — ABNORMAL HIGH (ref 3.80–5.20)
RDW: 18 % — ABNORMAL HIGH (ref 11.5–14.5)

## 2012-11-24 LAB — HEPATIC FUNCTION PANEL A (ARMC)
Alkaline Phosphatase: 115 U/L (ref 50–136)
Bilirubin,Total: 0.6 mg/dL (ref 0.2–1.0)
SGPT (ALT): 87 U/L — ABNORMAL HIGH (ref 12–78)

## 2012-11-24 LAB — CREATININE, SERUM
Creatinine: 0.88 mg/dL (ref 0.60–1.30)
EGFR (Non-African Amer.): >60

## 2012-11-26 LAB — AFP TUMOR MARKER

## 2012-11-29 LAB — HEPATIC FUNCTION PANEL A (ARMC)
Albumin: 2.8 g/dL — ABNORMAL LOW (ref 3.4–5.0)
Alkaline Phosphatase: 97 U/L (ref 50–136)
Bilirubin,Total: 0.5 mg/dL (ref 0.2–1.0)

## 2012-11-29 LAB — CANCER CENTER HEMATOCRIT: HCT: 48.2 % — ABNORMAL HIGH (ref 35.0–47.0)

## 2012-12-08 LAB — HEPATIC FUNCTION PANEL A (ARMC)
SGOT(AST): 50 U/L — ABNORMAL HIGH (ref 15–37)
Total Protein: 8.1 g/dL (ref 6.4–8.2)

## 2012-12-08 LAB — CANCER CENTER HEMATOCRIT: HCT: 50.4 % — ABNORMAL HIGH (ref 35.0–47.0)

## 2012-12-13 LAB — HEPATIC FUNCTION PANEL A (ARMC)
Bilirubin,Total: 0.7 mg/dL (ref 0.2–1.0)
SGOT(AST): 29 U/L (ref 15–37)
SGPT (ALT): 28 U/L (ref 12–78)
Total Protein: 7.5 g/dL (ref 6.4–8.2)

## 2012-12-20 LAB — CBC CANCER CENTER
Basophil #: 0.1 x10 3/mm (ref 0.0–0.1)
Basophil %: 1.1 %
Eosinophil #: 0.1 x10 3/mm (ref 0.0–0.7)
Eosinophil %: 1.3 %
Lymphocyte #: 2.3 x10 3/mm (ref 1.0–3.6)
Lymphocyte %: 32.4 %
MCHC: 32.6 g/dL (ref 32.0–36.0)
Monocyte #: 0.3 x10 3/mm (ref 0.2–0.9)
Neutrophil #: 4.3 x10 3/mm (ref 1.4–6.5)
Neutrophil %: 61 %
Platelet: 378 x10 3/mm (ref 150–440)
RDW: 20.8 % — ABNORMAL HIGH (ref 11.5–14.5)
WBC: 7.1 x10 3/mm (ref 3.6–11.0)

## 2012-12-20 LAB — HEPATIC FUNCTION PANEL A (ARMC)
Albumin: 2.8 g/dL — ABNORMAL LOW (ref 3.4–5.0)
Alkaline Phosphatase: 83 U/L (ref 50–136)
Bilirubin, Direct: 0.1 mg/dL (ref 0.00–0.20)
SGOT(AST): 34 U/L (ref 15–37)
SGPT (ALT): 28 U/L (ref 12–78)
Total Protein: 7.6 g/dL (ref 6.4–8.2)

## 2012-12-20 LAB — CREATININE, SERUM
Creatinine: 0.81 mg/dL (ref 0.60–1.30)
EGFR (African American): >60
EGFR (Non-African Amer.): >60

## 2012-12-21 ENCOUNTER — Ambulatory Visit: Payer: Self-pay | Admitting: Internal Medicine

## 2012-12-30 LAB — HEPATIC FUNCTION PANEL A (ARMC)
Alkaline Phosphatase: 83 U/L (ref 50–136)
Bilirubin, Direct: 0.1 mg/dL (ref 0.00–0.20)
Bilirubin,Total: 0.5 mg/dL (ref 0.2–1.0)
SGOT(AST): 34 U/L (ref 15–37)
SGPT (ALT): 23 U/L (ref 12–78)
Total Protein: 7.7 g/dL (ref 6.4–8.2)

## 2012-12-30 LAB — LIPASE, BLOOD: Lipase: 120 U/L (ref 73–393)

## 2013-01-04 LAB — AFP TUMOR MARKER

## 2013-01-05 LAB — HEPATIC FUNCTION PANEL A (ARMC)
Albumin: 3 g/dL — ABNORMAL LOW (ref 3.4–5.0)
Alkaline Phosphatase: 88 U/L (ref 50–136)
Bilirubin, Direct: 0.2 mg/dL (ref 0.00–0.20)
Bilirubin,Total: 1 mg/dL (ref 0.2–1.0)
SGOT(AST): 34 U/L (ref 15–37)
Total Protein: 8.4 g/dL — ABNORMAL HIGH (ref 6.4–8.2)

## 2013-01-17 LAB — CBC CANCER CENTER
Basophil #: 0 x10 3/mm (ref 0.0–0.1)
Basophil %: 0.4 %
Eosinophil #: 0.1 x10 3/mm (ref 0.0–0.7)
HCT: 47.4 % — ABNORMAL HIGH (ref 35.0–47.0)
HGB: 15.5 g/dL (ref 12.0–16.0)
Lymphocyte #: 2.2 x10 3/mm (ref 1.0–3.6)
Lymphocyte %: 19.8 %
MCH: 26.9 pg (ref 26.0–34.0)
Monocyte %: 2.7 %
Neutrophil #: 8.3 x10 3/mm — ABNORMAL HIGH (ref 1.4–6.5)
Neutrophil %: 75.7 %
RDW: 21.9 % — ABNORMAL HIGH (ref 11.5–14.5)
WBC: 10.9 x10 3/mm (ref 3.6–11.0)

## 2013-01-17 LAB — HEPATIC FUNCTION PANEL A (ARMC)
Albumin: 3 g/dL — ABNORMAL LOW (ref 3.4–5.0)
Bilirubin,Total: 0.7 mg/dL (ref 0.2–1.0)
SGOT(AST): 28 U/L (ref 15–37)
Total Protein: 7.8 g/dL (ref 6.4–8.2)

## 2013-01-20 ENCOUNTER — Ambulatory Visit: Payer: Self-pay | Admitting: Internal Medicine

## 2013-01-31 LAB — CBC CANCER CENTER
Basophil %: 0.5 %
Eosinophil %: 2 %
HCT: 45.5 % (ref 35.0–47.0)
HGB: 15.1 g/dL (ref 12.0–16.0)
Lymphocyte #: 1.7 x10 3/mm (ref 1.0–3.6)
MCHC: 33.1 g/dL (ref 32.0–36.0)
MCV: 84 fL (ref 80–100)
Monocyte #: 0.3 x10 3/mm (ref 0.2–0.9)
Monocyte %: 4.5 %
Neutrophil #: 5.2 x10 3/mm (ref 1.4–6.5)
Platelet: 260 x10 3/mm (ref 150–440)
RDW: 22.1 % — ABNORMAL HIGH (ref 11.5–14.5)
WBC: 7.4 x10 3/mm (ref 3.6–11.0)

## 2013-01-31 LAB — HEPATIC FUNCTION PANEL A (ARMC)
Bilirubin, Direct: 0.1 mg/dL (ref 0.00–0.20)
Bilirubin,Total: 0.6 mg/dL (ref 0.2–1.0)
SGPT (ALT): 25 U/L (ref 12–78)

## 2013-02-12 ENCOUNTER — Emergency Department: Payer: Self-pay | Admitting: Emergency Medicine

## 2013-02-17 LAB — HEPATIC FUNCTION PANEL A (ARMC)
Alkaline Phosphatase: 79 U/L (ref 50–136)
Bilirubin, Direct: 0.1 mg/dL (ref 0.00–0.20)
Bilirubin,Total: 0.8 mg/dL (ref 0.2–1.0)

## 2013-02-17 LAB — CBC CANCER CENTER
Basophil #: 0 x10 3/mm (ref 0.0–0.1)
HCT: 45.5 % (ref 35.0–47.0)
HGB: 15.4 g/dL (ref 12.0–16.0)
Lymphocyte #: 1.7 x10 3/mm (ref 1.0–3.6)
MCHC: 33.7 g/dL (ref 32.0–36.0)
MCV: 86 fL (ref 80–100)
Neutrophil #: 5.8 x10 3/mm (ref 1.4–6.5)
Neutrophil %: 73 %
Platelet: 292 x10 3/mm (ref 150–440)
WBC: 8 x10 3/mm (ref 3.6–11.0)

## 2013-02-17 LAB — CREATININE, SERUM
Creatinine: 0.85 mg/dL (ref 0.60–1.30)
EGFR (African American): >60
EGFR (Non-African Amer.): >60

## 2013-02-20 ENCOUNTER — Ambulatory Visit: Payer: Self-pay | Admitting: Internal Medicine

## 2013-03-07 LAB — CBC CANCER CENTER
Basophil #: 0.1 x10 3/mm (ref 0.0–0.1)
Eosinophil #: 0 x10 3/mm (ref 0.0–0.7)
Eosinophil %: 0.4 %
HCT: 48.8 % — ABNORMAL HIGH (ref 35.0–47.0)
HGB: 16.8 g/dL — ABNORMAL HIGH (ref 12.0–16.0)
Lymphocyte #: 2.2 x10 3/mm (ref 1.0–3.6)
MCHC: 34.5 g/dL (ref 32.0–36.0)
MCV: 88 fL (ref 80–100)
Monocyte #: 0.4 x10 3/mm (ref 0.2–0.9)
Monocyte %: 3.8 %
Neutrophil %: 71 %
Platelet: 368 x10 3/mm (ref 150–440)
RDW: 18.3 % — ABNORMAL HIGH (ref 11.5–14.5)
WBC: 9.3 x10 3/mm (ref 3.6–11.0)

## 2013-03-07 LAB — HEPATIC FUNCTION PANEL A (ARMC)
Albumin: 3.3 g/dL — ABNORMAL LOW (ref 3.4–5.0)
Bilirubin, Direct: 0.1 mg/dL (ref 0.00–0.20)
Bilirubin,Total: 0.7 mg/dL (ref 0.2–1.0)
SGOT(AST): 21 U/L (ref 15–37)

## 2013-03-07 LAB — CREATININE, SERUM
Creatinine: 0.92 mg/dL (ref 0.60–1.30)
EGFR (African American): >60
EGFR (Non-African Amer.): 59 — ABNORMAL LOW

## 2013-03-22 ENCOUNTER — Ambulatory Visit: Payer: Self-pay | Admitting: Internal Medicine

## 2013-04-04 LAB — HEPATIC FUNCTION PANEL A (ARMC)
Albumin: 3.2 g/dL — ABNORMAL LOW (ref 3.4–5.0)
Bilirubin, Direct: 0.1 mg/dL (ref 0.00–0.20)
Bilirubin,Total: 0.6 mg/dL (ref 0.2–1.0)
SGOT(AST): 26 U/L (ref 15–37)
SGPT (ALT): 24 U/L (ref 12–78)
Total Protein: 8.5 g/dL — ABNORMAL HIGH (ref 6.4–8.2)

## 2013-04-04 LAB — CANCER CENTER HEMATOCRIT: HCT: 49 % — ABNORMAL HIGH (ref 35.0–47.0)

## 2013-04-20 LAB — AFP TUMOR MARKER

## 2013-04-22 ENCOUNTER — Ambulatory Visit: Payer: Self-pay | Admitting: Internal Medicine

## 2013-04-25 LAB — CBC CANCER CENTER
Basophil #: 0.1 x10 3/mm (ref 0.0–0.1)
Basophil %: 0.8 %
Eosinophil %: 2.7 %
HCT: 48.5 % — ABNORMAL HIGH (ref 35.0–47.0)
HGB: 16.8 g/dL — ABNORMAL HIGH (ref 12.0–16.0)
Lymphocyte #: 1.8 x10 3/mm (ref 1.0–3.6)
Lymphocyte %: 20.7 %
MCH: 31 pg (ref 26.0–34.0)
MCHC: 34.6 g/dL (ref 32.0–36.0)
Monocyte #: 0.3 x10 3/mm (ref 0.2–0.9)
Monocyte %: 4 %
Neutrophil #: 6.2 x10 3/mm (ref 1.4–6.5)
Neutrophil %: 71.8 %
Platelet: 320 x10 3/mm (ref 150–440)

## 2013-04-25 LAB — HEPATIC FUNCTION PANEL A (ARMC)
Albumin: 3.1 g/dL — ABNORMAL LOW (ref 3.4–5.0)
Alkaline Phosphatase: 108 U/L (ref 50–136)
Bilirubin, Direct: 0.1 mg/dL (ref 0.00–0.20)
SGOT(AST): 31 U/L (ref 15–37)
Total Protein: 8 g/dL (ref 6.4–8.2)

## 2013-04-25 LAB — CREATININE, SERUM
Creatinine: 0.83 mg/dL (ref 0.60–1.30)
EGFR (African American): >60
EGFR (Non-African Amer.): >60

## 2013-04-27 LAB — AFP TUMOR MARKER

## 2013-05-09 LAB — CBC CANCER CENTER
Basophil #: 0.1 x10 3/mm (ref 0.0–0.1)
Basophil %: 0.7 %
Eosinophil #: 0.2 x10 3/mm (ref 0.0–0.7)
HCT: 47 % (ref 35.0–47.0)
HGB: 16.4 g/dL — ABNORMAL HIGH (ref 12.0–16.0)
Lymphocyte #: 1.9 x10 3/mm (ref 1.0–3.6)
Lymphocyte %: 22.5 %
MCH: 31.1 pg (ref 26.0–34.0)
MCV: 89 fL (ref 80–100)
Platelet: 337 x10 3/mm (ref 150–440)
RBC: 5.27 10*6/uL — ABNORMAL HIGH (ref 3.80–5.20)
RDW: 14.8 % — ABNORMAL HIGH (ref 11.5–14.5)
WBC: 8.3 x10 3/mm (ref 3.6–11.0)

## 2013-05-09 LAB — HEPATIC FUNCTION PANEL A (ARMC)
Albumin: 3.1 g/dL — ABNORMAL LOW (ref 3.4–5.0)
Alkaline Phosphatase: 98 U/L (ref 50–136)
Bilirubin, Direct: 0.1 mg/dL (ref 0.00–0.20)
SGOT(AST): 33 U/L (ref 15–37)
SGPT (ALT): 25 U/L (ref 12–78)
Total Protein: 7.6 g/dL (ref 6.4–8.2)

## 2013-05-11 LAB — AFP TUMOR MARKER

## 2013-05-23 ENCOUNTER — Ambulatory Visit: Payer: Self-pay | Admitting: Internal Medicine

## 2013-06-13 LAB — CREATININE, SERUM
EGFR (African American): >60
EGFR (Non-African Amer.): >60

## 2013-06-13 LAB — HEPATIC FUNCTION PANEL A (ARMC)
Albumin: 3 g/dL — ABNORMAL LOW (ref 3.4–5.0)
Alkaline Phosphatase: 101 U/L (ref 50–136)
Bilirubin, Direct: 0.1 mg/dL (ref 0.00–0.20)
SGOT(AST): 32 U/L (ref 15–37)
SGPT (ALT): 28 U/L (ref 12–78)
Total Protein: 7.8 g/dL (ref 6.4–8.2)

## 2013-06-15 ENCOUNTER — Telehealth: Payer: Self-pay | Admitting: *Deleted

## 2013-06-15 LAB — AFP TUMOR MARKER

## 2013-06-17 ENCOUNTER — Encounter: Payer: Self-pay | Admitting: Internal Medicine

## 2013-06-17 ENCOUNTER — Ambulatory Visit (INDEPENDENT_AMBULATORY_CARE_PROVIDER_SITE_OTHER): Payer: 59 | Admitting: Internal Medicine

## 2013-06-17 VITALS — BP 150/90 | HR 87 | Temp 98.1°F | Wt 157.0 lb

## 2013-06-17 DIAGNOSIS — I10 Essential (primary) hypertension: Secondary | ICD-10-CM | POA: Insufficient documentation

## 2013-06-17 DIAGNOSIS — R413 Other amnesia: Secondary | ICD-10-CM

## 2013-06-17 DIAGNOSIS — C229 Malignant neoplasm of liver, not specified as primary or secondary: Secondary | ICD-10-CM | POA: Insufficient documentation

## 2013-06-17 LAB — CBC WITH DIFFERENTIAL/PLATELET
Basophils Absolute: 0 10*3/uL (ref 0.0–0.1)
Basophils Relative: 0.5 % (ref 0.0–3.0)
Eosinophils Absolute: 0.2 10*3/uL (ref 0.0–0.7)
HCT: 48.1 % — ABNORMAL HIGH (ref 36.0–46.0)
Hemoglobin: 16.1 g/dL — ABNORMAL HIGH (ref 12.0–15.0)
Lymphocytes Relative: 22.3 % (ref 12.0–46.0)
Lymphs Abs: 2 10*3/uL (ref 0.7–4.0)
MCHC: 33.6 g/dL (ref 30.0–36.0)
Monocytes Relative: 4.2 % (ref 3.0–12.0)
Neutro Abs: 6.4 10*3/uL (ref 1.4–7.7)
RBC: 5.45 Mil/uL — ABNORMAL HIGH (ref 3.87–5.11)
RDW: 15.8 % — ABNORMAL HIGH (ref 11.5–14.6)

## 2013-06-17 LAB — COMPREHENSIVE METABOLIC PANEL
ALT: 19 U/L (ref 0–35)
AST: 24 U/L (ref 0–37)
BUN: 10 mg/dL (ref 6–23)
Creatinine, Ser: 0.7 mg/dL (ref 0.4–1.2)
Total Bilirubin: 0.7 mg/dL (ref 0.3–1.2)

## 2013-06-17 MED ORDER — AMLODIPINE BESYLATE 5 MG PO TABS: 5.0000 mg | ORAL_TABLET | Freq: Every day | ORAL | Status: DC

## 2013-06-17 NOTE — Progress Notes (Signed)
Subjective:    Patient ID: Chelsea Howard, female    DOB: December 28, 1931, 79 y.o.   MRN: [id number]  HPI 79YO female presents for follow up. She has not been seen since 2013. Her husband reports that she became acutely ill in late 2013, and he took her to the ED for evaluation. She was found to have a mass in her liver, which was ultimately biopsied and diagnosed as liver cancer. Her husband reports she was given 6 months to live, but has lived nearly 10months and is doing well on Nexavar. She has not had any nausea, vomiting, change in bowel habits, change in appetite. She reports normal energy level. She reports she is feeling well.   Her husband reports her BP has been elevated, near 180-190s/100s both at home and when checked in her oncologist's office. She has not had chest pain or palpitations, but does have occasional headaches. She has been taking Quinapril as prescribed.  Her husband is also concerned about memory loss. He notes that she forgets things quickly. He questions whether she might need medication for this.  Outpatient Encounter Prescriptions as of 06/17/2013  Medication Sig Dispense Refill  . aspirin 81 MG tablet Take 81 mg by mouth daily.        . quinapril (ACCUPRIL) 10 MG tablet Take 1 tablet (10 mg total) by mouth 2 (two) times daily.  60 tablet  11  . SORAfenib (NEXAVAR) 200 MG tablet Take 400 mg by mouth daily. Give on an empty stomach 1 hour before or 2 hours after meals.       No facility-administered encounter medications on file as of 06/17/2013.   BP 150/90  Pulse 87  Temp(Src) 98.1 F (36.7 C) (Oral)  Wt 157 lb (71.215 kg)  BMI 28.03 kg/m2  SpO2 94%  Review of Systems  Constitutional: Negative for fever, chills, appetite change, fatigue and unexpected weight change.  HENT: Negative for ear pain, congestion, sore throat, trouble swallowing, neck pain, voice change and sinus pressure.   Eyes: Negative for visual disturbance.  Respiratory: Negative for cough,  shortness of breath, wheezing and stridor.   Cardiovascular: Negative for chest pain, palpitations and leg swelling.  Gastrointestinal: Negative for nausea, vomiting, abdominal pain, diarrhea, constipation, blood in stool, abdominal distention and anal bleeding.  Genitourinary: Negative for dysuria and flank pain.  Musculoskeletal: Negative for myalgias, arthralgias and gait problem.  Skin: Negative for color change and rash.  Neurological: Negative for dizziness and headaches.  Hematological: Negative for adenopathy. Does not bruise/bleed easily.  Psychiatric/Behavioral: Positive for decreased concentration. Negative for suicidal ideas, sleep disturbance and dysphoric mood. The patient is not nervous/anxious.        Objective:   Physical Exam  Constitutional: She is oriented to person, place, and time. She appears well-developed and well-nourished. No distress.  HENT:  Head: Normocephalic and atraumatic.  Right Ear: External ear normal.  Left Ear: External ear normal.  Nose: Nose normal.  Mouth/Throat: Oropharynx is clear and moist. No oropharyngeal exudate.  Eyes: Conjunctivae are normal. Pupils are equal, round, and reactive to light. Right eye exhibits no discharge. Left eye exhibits no discharge. No scleral icterus.  Neck: Normal range of motion. Neck supple. No tracheal deviation present. No thyromegaly present.  Cardiovascular: Normal rate, regular rhythm, normal heart sounds and intact distal pulses.  Exam reveals no gallop and no friction rub.   No murmur heard. Pulmonary/Chest: Effort normal and breath sounds normal. No accessory muscle usage. Not tachypneic. No respiratory distress.  She has no decreased breath sounds. She has no wheezes. She has no rhonchi. She has no rales. She exhibits no tenderness.  Abdominal: Soft. Bowel sounds are normal. She exhibits no distension and no mass. There is no tenderness. There is no guarding.  Musculoskeletal: Normal range of motion. She  exhibits no edema and no tenderness.  Lymphadenopathy:    She has no cervical adenopathy.  Neurological: She is alert and oriented to person, place, and time. No cranial nerve deficit. She exhibits normal muscle tone. Coordination normal.  Skin: Skin is warm and dry. No rash noted. She is not diaphoretic. No erythema. No pallor.  Psychiatric: She has a normal mood and affect. Her behavior is normal. Judgment and thought content normal.          Assessment & Plan:

## 2013-06-17 NOTE — Assessment & Plan Note (Signed)
Pt husband reports pt has some short term memory loss. Exam normal today. We discussed that patients may report memory loss, particularly during chemotherapy and increased periods of stress. Will check TSH and B12 with labs today.

## 2013-06-17 NOTE — Patient Instructions (Addendum)
Start Amlodipine 5mg  daily.  Labs today.  Follow up 2-3 weeks.

## 2013-06-17 NOTE — Assessment & Plan Note (Addendum)
BP Readings from Last 3 Encounters:  06/17/13 150/90  07/27/12 160/90  01/19/12 138/78   BP recently elevated, as high as 180s-190s/100s at home and (per pt husband) during recent chemotherapy. Will check renal function with labs. Will add Amlodipine 5mg  daily. Pt husband will monitor pt BP at home. Follow up 2 weeks and prn.

## 2013-06-17 NOTE — Assessment & Plan Note (Signed)
Pt reports she was diagnosed with liver cancer and has been treated by Dr. Neale Burly. Will request records on evaluation and management.

## 2013-06-20 LAB — CANCER CENTER HEMATOCRIT: HCT: 52.8 % — ABNORMAL HIGH (ref 35.0–47.0)

## 2013-06-22 ENCOUNTER — Ambulatory Visit: Payer: Self-pay | Admitting: Internal Medicine

## 2013-07-05 LAB — HEPATIC FUNCTION PANEL A (ARMC)
Albumin: 3.1 g/dL — ABNORMAL LOW (ref 3.4–5.0)
Bilirubin, Direct: 0.1 mg/dL (ref 0.00–0.20)
Bilirubin,Total: 0.5 mg/dL (ref 0.2–1.0)
SGOT(AST): 37 U/L (ref 15–37)
SGPT (ALT): 30 U/L (ref 12–78)
Total Protein: 7.9 g/dL (ref 6.4–8.2)

## 2013-07-08 ENCOUNTER — Ambulatory Visit (INDEPENDENT_AMBULATORY_CARE_PROVIDER_SITE_OTHER): Payer: 59 | Admitting: Internal Medicine

## 2013-07-08 ENCOUNTER — Encounter: Payer: Self-pay | Admitting: Internal Medicine

## 2013-07-08 VITALS — BP 160/90 | HR 88 | Temp 97.7°F | Wt 168.0 lb

## 2013-07-08 DIAGNOSIS — I10 Essential (primary) hypertension: Secondary | ICD-10-CM

## 2013-07-08 DIAGNOSIS — C229 Malignant neoplasm of liver, not specified as primary or secondary: Secondary | ICD-10-CM

## 2013-07-08 DIAGNOSIS — R413 Other amnesia: Secondary | ICD-10-CM

## 2013-07-08 MED ORDER — AMLODIPINE BESYLATE 10 MG PO TABS: 10.0000 mg | ORAL_TABLET | Freq: Every day | ORAL | Status: AC

## 2013-07-08 NOTE — Assessment & Plan Note (Signed)
Continues on Nexavar. Will request notes next week from Dr. Neale Burly.

## 2013-07-08 NOTE — Assessment & Plan Note (Signed)
Mild symptoms of short term memory loss. B12 and thyroid function normal. Discussed with pt and her husband today. She does not want to proceed with referral to psychologist for cognitive testing. Will monitor for now.

## 2013-07-08 NOTE — Assessment & Plan Note (Signed)
BP Readings from Last 3 Encounters:  07/08/13 160/90  06/17/13 150/90  07/27/12 160/90   BP continues to be elevated, typically 140-160/90s. Will increase Amlodipine to 10mg  daily. Consider addition of betablocker if no improvement next visit.

## 2013-07-08 NOTE — Patient Instructions (Signed)
Increase Amlodipine to 10mg  daily.  Continue to monitor blood pressure 1-2 times per week.  Follow up in 4 weeks.

## 2013-07-08 NOTE — Progress Notes (Signed)
Subjective:    Patient ID: Chelsea Howard, female    DOB: 1931/12/19, 79 y.o.   MRN: [id number]  HPI 79YO female with liver cancer, hypertension presents for follow up. Feeling well in general. BP continues to be mildly elevated at home, typically 140-160s/90s.  No headache, chest pain, palpitations. Taking Amlodipine and Quinapril as directed. No side effects noted.   Reports that her short-term memory loss is unchanged. She does not wish to proceed with cognitive testing.  Outpatient Encounter Prescriptions as of 07/08/2013  Medication Sig Dispense Refill  . amLODipine (NORVASC) 10 MG tablet Take 1 tablet (10 mg total) by mouth daily.  90 tablet  3  . aspirin 81 MG tablet Take 81 mg by mouth daily.        Marland Kitchen lactulose (CHRONULAC) 10 GM/15ML solution Take 20 g by mouth 2 (two) times daily.      . quinapril (ACCUPRIL) 10 MG tablet Take 1 tablet (10 mg total) by mouth 2 (two) times daily.  60 tablet  11  . SORAfenib (NEXAVAR) 200 MG tablet Take 400 mg by mouth daily. Give on an empty stomach 1 hour before or 2 hours after meals.      . [DISCONTINUED] amLODipine (NORVASC) 5 MG tablet Take 1 tablet (5 mg total) by mouth daily.  90 tablet  3   No facility-administered encounter medications on file as of 07/08/2013.   BP 160/90  Pulse 88  Temp(Src) 97.7 F (36.5 C) (Oral)  Wt 168 lb (76.204 kg)  BMI 29.99 kg/m2  Review of Systems  Constitutional: Positive for fatigue. Negative for fever, chills, appetite change and unexpected weight change.  HENT: Negative for congestion, ear pain, sinus pressure, sore throat, trouble swallowing and voice change.   Eyes: Negative for visual disturbance.  Respiratory: Negative for cough, shortness of breath, wheezing and stridor.   Cardiovascular: Negative for chest pain, palpitations and leg swelling.  Gastrointestinal: Negative for nausea, vomiting, abdominal pain, diarrhea, constipation, blood in stool, abdominal distention and anal bleeding.   Genitourinary: Negative for dysuria and flank pain.  Musculoskeletal: Negative for arthralgias, gait problem, myalgias and neck pain.  Skin: Negative for color change and rash.  Neurological: Negative for dizziness and headaches.  Hematological: Negative for adenopathy. Does not bruise/bleed easily.  Psychiatric/Behavioral: Positive for decreased concentration. Negative for suicidal ideas, sleep disturbance and dysphoric mood. The patient is not nervous/anxious.        Objective:   Physical Exam  Constitutional: She is oriented to person, place, and time. She appears well-developed and well-nourished. No distress.  HENT:  Head: Normocephalic and atraumatic.  Right Ear: External ear normal.  Left Ear: External ear normal.  Nose: Nose normal.  Mouth/Throat: Oropharynx is clear and moist. No oropharyngeal exudate.  Eyes: Conjunctivae are normal. Pupils are equal, round, and reactive to light. Right eye exhibits no discharge. Left eye exhibits no discharge. No scleral icterus.  Neck: Normal range of motion. Neck supple. No tracheal deviation present. No thyromegaly present.  Cardiovascular: Normal rate, regular rhythm, normal heart sounds and intact distal pulses.  Exam reveals no gallop and no friction rub.   No murmur heard. Pulmonary/Chest: Effort normal and breath sounds normal. No accessory muscle usage. Not tachypneic. No respiratory distress. She has no decreased breath sounds. She has no wheezes. She has no rhonchi. She has no rales. She exhibits no tenderness.  Abdominal: Soft. Bowel sounds are normal. She exhibits no distension and no mass. There is no tenderness. There is no rebound  and no guarding.  Musculoskeletal: Normal range of motion. She exhibits no edema and no tenderness.  Lymphadenopathy:    She has no cervical adenopathy.  Neurological: She is alert and oriented to person, place, and time. No cranial nerve deficit. She exhibits normal muscle tone. Coordination normal.   Skin: Skin is warm and dry. No rash noted. She is not diaphoretic. No erythema. No pallor.  Psychiatric: She has a normal mood and affect. Her behavior is normal. Judgment and thought content normal.          Assessment & Plan:

## 2013-07-12 DIAGNOSIS — Z79899 Other long term (current) drug therapy: Secondary | ICD-10-CM

## 2013-07-12 LAB — CBC CANCER CENTER
Basophil #: 0.1 x10 3/mm (ref 0.0–0.1)
Basophil %: 0.8 %
Eosinophil %: 2 %
HCT: 49.7 % — ABNORMAL HIGH (ref 35.0–47.0)
HGB: 16.5 g/dL — ABNORMAL HIGH (ref 12.0–16.0)
MCH: 29.3 pg (ref 26.0–34.0)
MCHC: 33.2 g/dL (ref 32.0–36.0)
Monocyte #: 0.4 x10 3/mm (ref 0.2–0.9)
Neutrophil #: 5.9 x10 3/mm (ref 1.4–6.5)
Neutrophil %: 65 %
Platelet: 372 x10 3/mm (ref 150–440)
RBC: 5.63 10*6/uL — ABNORMAL HIGH (ref 3.80–5.20)
RDW: 15.2 % — ABNORMAL HIGH (ref 11.5–14.5)
WBC: 9.1 x10 3/mm (ref 3.6–11.0)

## 2013-07-12 LAB — CREATININE, SERUM: Creatinine: 0.87 mg/dL (ref 0.60–1.30)

## 2013-07-20 LAB — HEPATIC FUNCTION PANEL A (ARMC)
Alkaline Phosphatase: 98 U/L (ref 50–136)
Bilirubin, Direct: 0.1 mg/dL (ref 0.00–0.20)
SGOT(AST): 41 U/L — ABNORMAL HIGH (ref 15–37)
SGPT (ALT): 29 U/L (ref 12–78)
Total Protein: 8 g/dL (ref 6.4–8.2)

## 2013-07-23 ENCOUNTER — Ambulatory Visit: Payer: Self-pay | Admitting: Internal Medicine

## 2013-07-29 ENCOUNTER — Ambulatory Visit: Payer: 59 | Admitting: Internal Medicine

## 2013-07-29 LAB — CBC CANCER CENTER
Basophil #: 0.1 "x10 3/mm "
Basophil %: 1 %
Eosinophil #: 0.2 "x10 3/mm "
Eosinophil %: 2.3 %
HCT: 51.4 % — ABNORMAL HIGH
HGB: 17.4 g/dL — ABNORMAL HIGH
Lymphocyte %: 23.1 %
Lymphs Abs: 1.5 "x10 3/mm "
MCH: 29.2 pg
MCHC: 33.8 g/dL
MCV: 86 fL
Monocyte #: 0.3 "x10 3/mm "
Monocyte %: 4 %
Neutrophil #: 4.6 "x10 3/mm "
Neutrophil %: 69.6 %
Platelet: 347 "x10 3/mm "
RBC: 5.97 "x10 6/mm " — ABNORMAL HIGH
RDW: 15.4 % — ABNORMAL HIGH
WBC: 6.6 "x10 3/mm "

## 2013-07-29 LAB — SGOT (AST)(ARMC): SGOT(AST): 42 U/L — ABNORMAL HIGH

## 2013-08-01 LAB — AFP TUMOR MARKER

## 2013-08-06 ENCOUNTER — Other Ambulatory Visit: Payer: Self-pay | Admitting: Internal Medicine

## 2013-08-08 LAB — CREATININE, SERUM
Creatinine: 0.82 mg/dL (ref 0.60–1.30)
EGFR (African American): >60
EGFR (Non-African Amer.): >60

## 2013-08-08 LAB — CANCER CENTER HEMATOCRIT: HCT: 54.4 % — ABNORMAL HIGH (ref 35.0–47.0)

## 2013-08-13 IMAGING — CR DG CHEST 2V
1 series · 2 of 2 positions shown · non-contrast
Comparison: none

REASON FOR EXAM: lung mass; pulmonary nodule
COMMENTS:

[Series 1: pa · 0.17mm/px · 2 of 2 slices shown]
[im 1/2]
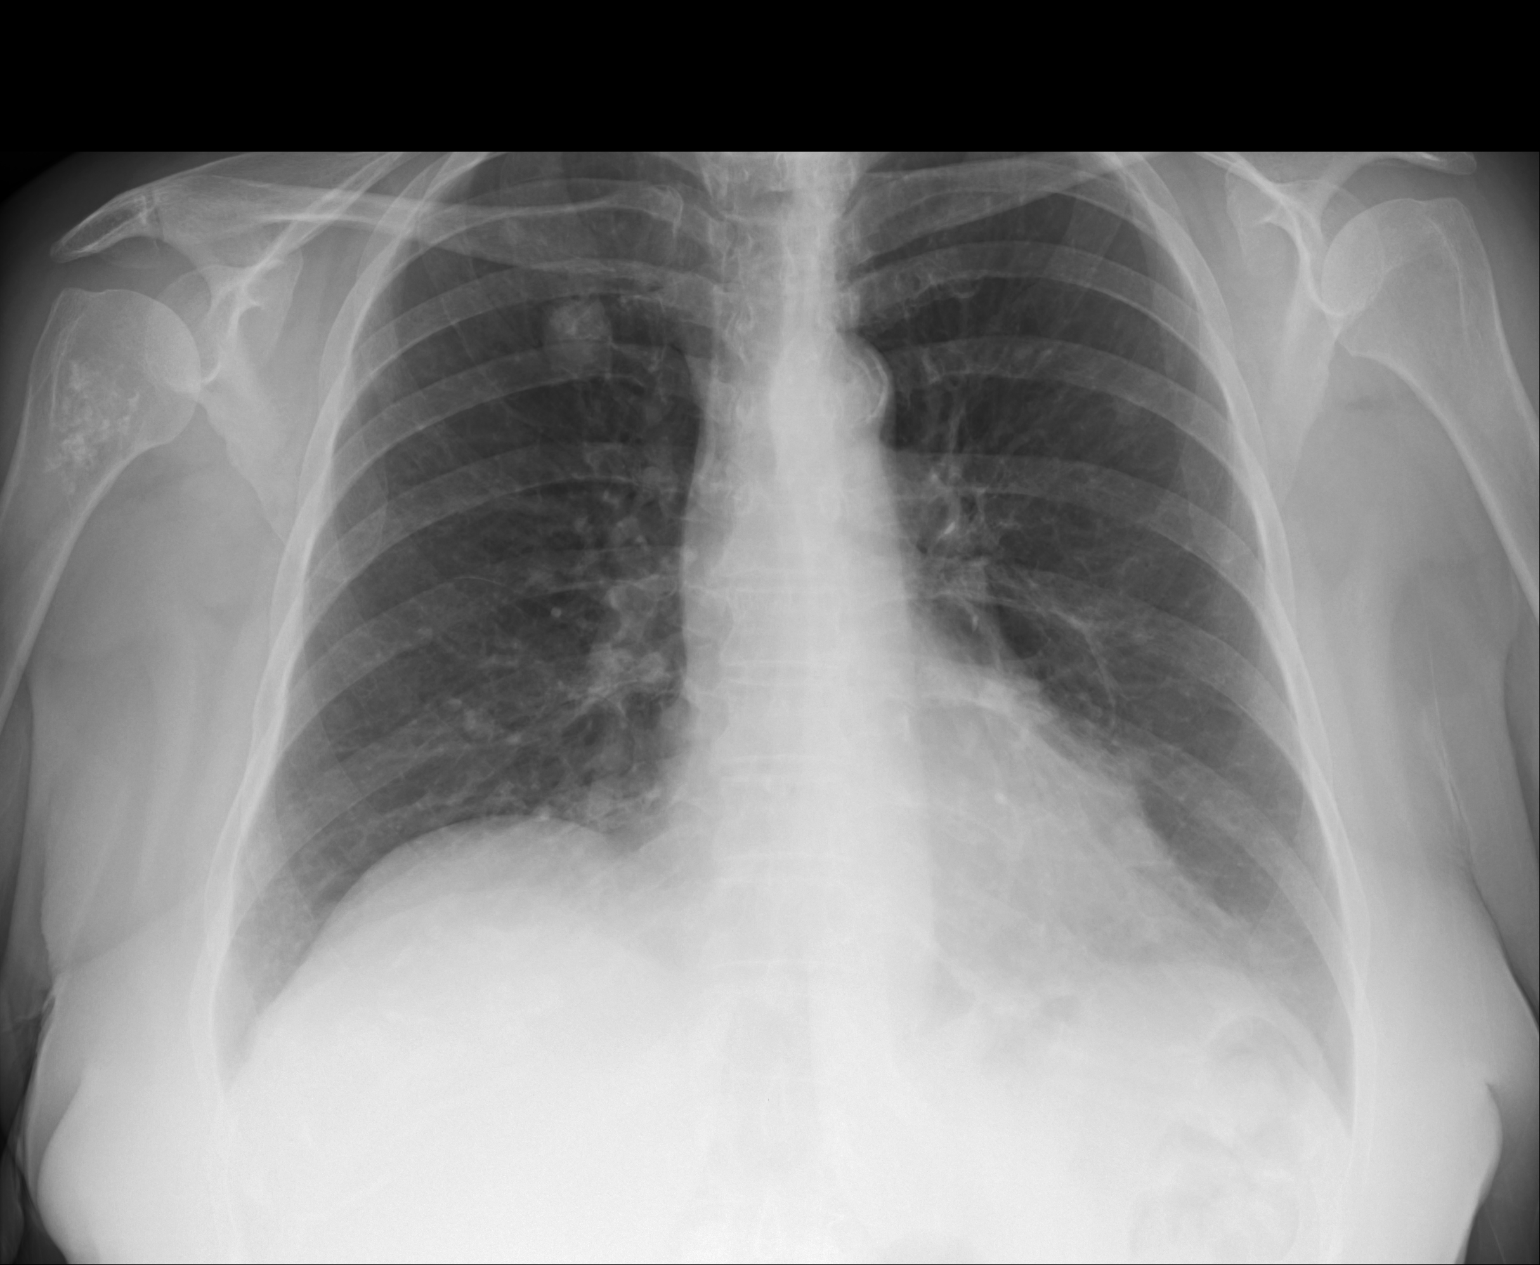
[im 2/2]
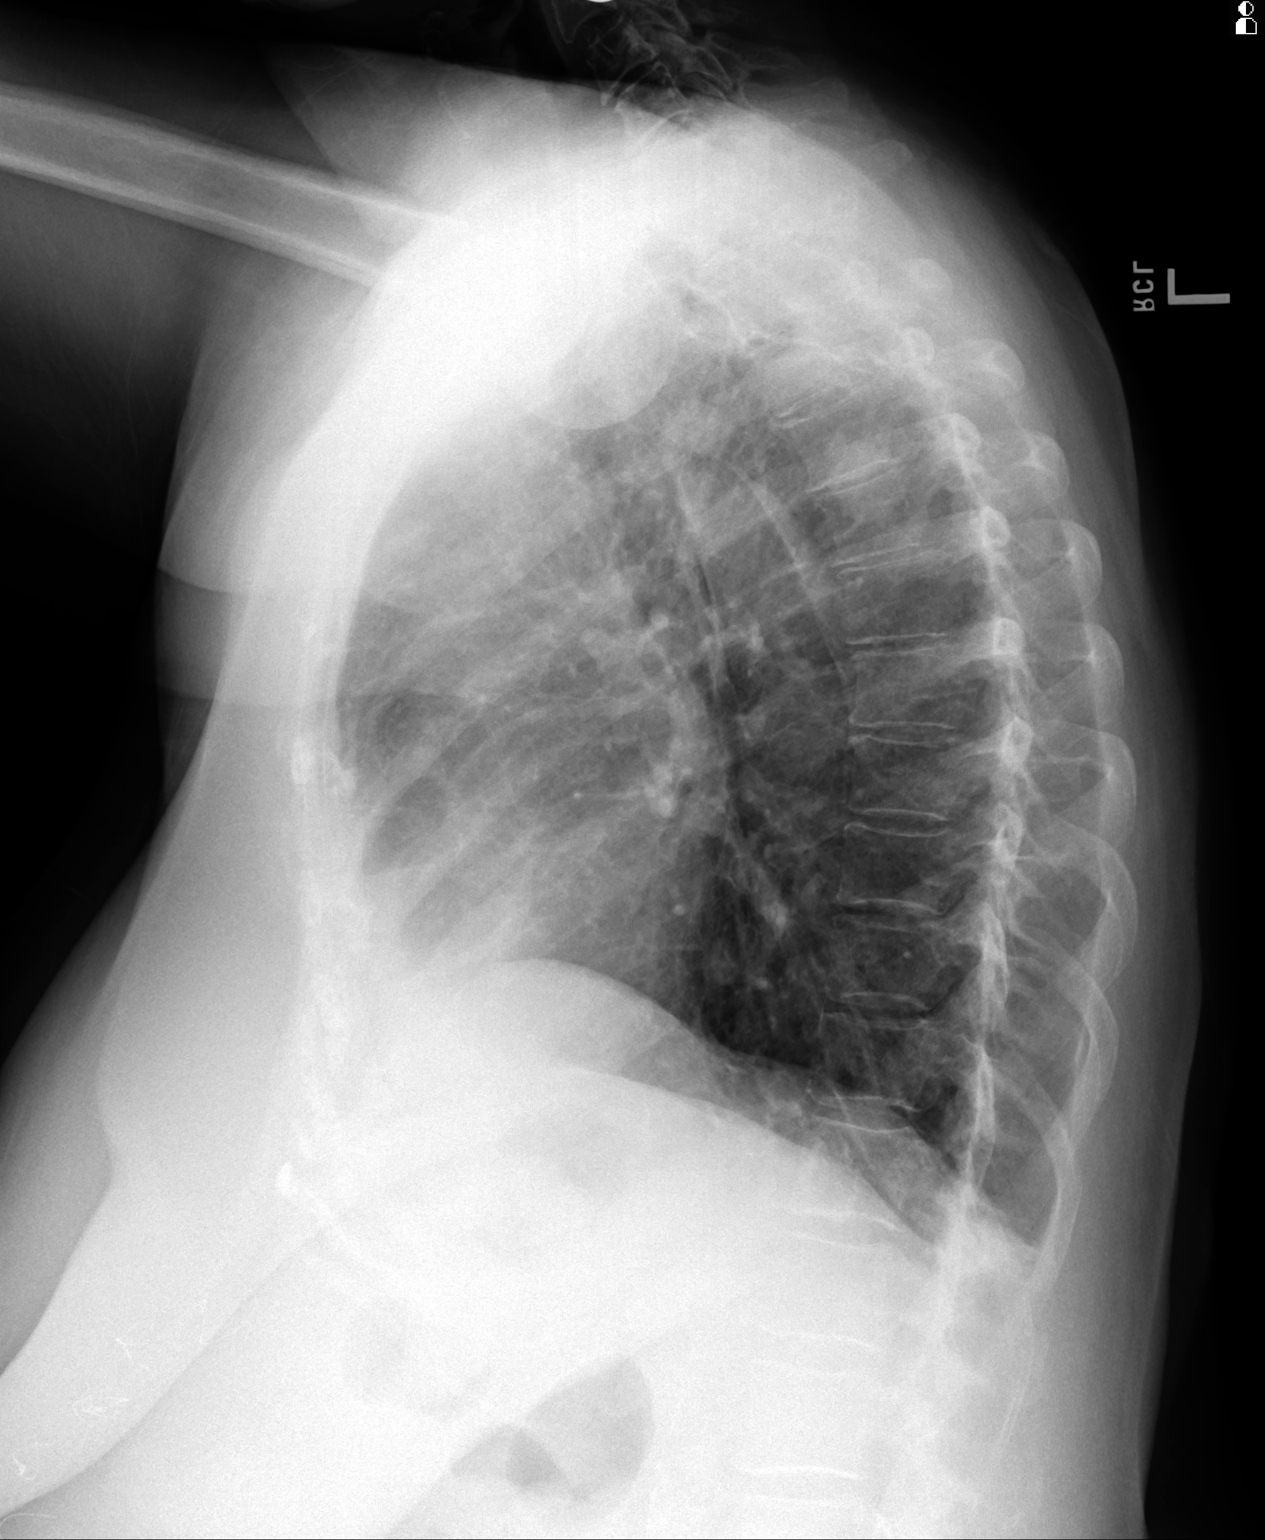

[2 of 2 positions shown; findings below may reference images not displayed]

PROCEDURE:     DXR - DXR CHEST PA (OR AP) AND LATERAL  - August 26, 2012  [DATE]

RESULT:     The lungs are adequately inflated. There is an abnormal 2 cm
diameter nodule in the right upper lobe. The cardiac silhouette is normal in
size. The mediastinum is normal in width. There is no pleural effusion
appearing the bony thorax exhibits no acute abnormality.
IMPRESSION: There is a nodule in the right upper lobe. This could
reflect malignancy. There is no evidence of CHF nor of pneumonia.

A CT scan of the abdomen and pelvis dated 17 August, 2012 demonstrated
bilateral lower lobe pulmonary nodules. A followup CT scan of the chest is
recommended.

[REDACTED]

## 2013-08-15 ENCOUNTER — Encounter: Payer: Self-pay | Admitting: Internal Medicine

## 2013-08-15 ENCOUNTER — Ambulatory Visit (INDEPENDENT_AMBULATORY_CARE_PROVIDER_SITE_OTHER): Payer: 59 | Admitting: Internal Medicine

## 2013-08-15 VITALS — BP 136/70 | HR 84 | Resp 12 | Ht 62.75 in | Wt 171.5 lb

## 2013-08-15 DIAGNOSIS — I10 Essential (primary) hypertension: Secondary | ICD-10-CM

## 2013-08-15 NOTE — Assessment & Plan Note (Signed)
BP Readings from Last 3 Encounters:  08/15/13 136/70  07/08/13 160/90  06/17/13 150/90   Blood pressure much improved with higher dose of amlodipine. Will continue.

## 2013-08-15 NOTE — Progress Notes (Signed)
Pre visit review using our clinic review tool, if applicable. No additional management support is needed unless otherwise documented below in the visit note. 

## 2013-08-15 NOTE — Progress Notes (Signed)
Subjective:    Patient ID: Chelsea Howard, female    DOB: 1932-04-20, 79 y.o.   MRN: [id number]  HPI 79 year old female with history of hepatocellular carcinoma, hypertension presents for followup. At her last visit she was noted to have elevated blood pressure and amlodipine dose was increased to 10 mg daily. She has been compliant with medication change. She denies any chest pain, palpitations, headache. She is generally feeling well. She is scheduled for followup with her oncologist next week for lab work to monitor Nexavar.  Outpatient Encounter Prescriptions as of 08/15/2013  Medication Sig  . amLODipine (NORVASC) 10 MG tablet Take 1 tablet (10 mg total) by mouth daily.  Marland Kitchen aspirin 81 MG tablet Take 81 mg by mouth daily.    Marland Kitchen lactulose (CHRONULAC) 10 GM/15ML solution Take 20 g by mouth 2 (two) times daily.  . quinapril (ACCUPRIL) 10 MG tablet take 1 tablet by mouth twice a day  . SORAfenib (NEXAVAR) 200 MG tablet Take 400 mg by mouth daily. Give on an empty stomach 1 hour before or 2 hours after meals.   BP 136/70  Pulse 84  Resp 12  Ht 5' 2.75" (1.594 m)  Wt 171 lb 8 oz (77.792 kg)  BMI 30.62 kg/m2  SpO2 97%  Review of Systems  Constitutional: Negative for fever, chills, appetite change, fatigue and unexpected weight change.  HENT: Negative for congestion, ear pain, sinus pressure, sore throat, trouble swallowing and voice change.   Eyes: Negative for visual disturbance.  Respiratory: Negative for cough, shortness of breath, wheezing and stridor.   Cardiovascular: Negative for chest pain, palpitations and leg swelling.  Gastrointestinal: Negative for nausea, vomiting, abdominal pain, diarrhea, constipation, blood in stool, abdominal distention and anal bleeding.  Genitourinary: Negative for dysuria and flank pain.  Musculoskeletal: Negative for arthralgias, gait problem, myalgias and neck pain.  Skin: Negative for color change and rash.  Neurological: Negative for dizziness  and headaches.  Hematological: Negative for adenopathy. Does not bruise/bleed easily.  Psychiatric/Behavioral: Negative for suicidal ideas, sleep disturbance and dysphoric mood. The patient is not nervous/anxious.        Objective:   Physical Exam  Constitutional: She is oriented to person, place, and time. She appears well-developed and well-nourished. No distress.  HENT:  Head: Normocephalic and atraumatic.  Right Ear: External ear normal.  Left Ear: External ear normal.  Nose: Nose normal.  Mouth/Throat: Oropharynx is clear and moist. No oropharyngeal exudate.  Eyes: Conjunctivae are normal. Pupils are equal, round, and reactive to light. Right eye exhibits no discharge. Left eye exhibits no discharge. No scleral icterus.  Neck: Normal range of motion. Neck supple. No tracheal deviation present. No thyromegaly present.  Cardiovascular: Normal rate, regular rhythm, normal heart sounds and intact distal pulses.  Exam reveals no gallop and no friction rub.   No murmur heard. Pulmonary/Chest: Effort normal and breath sounds normal. No accessory muscle usage. Not tachypneic. No respiratory distress. She has no decreased breath sounds. She has no wheezes. She has no rhonchi. She has no rales. She exhibits no tenderness.  Musculoskeletal: Normal range of motion. She exhibits no edema and no tenderness.  Lymphadenopathy:    She has no cervical adenopathy.  Neurological: She is alert and oriented to person, place, and time. No cranial nerve deficit. She exhibits normal muscle tone. Coordination normal.  Skin: Skin is warm and dry. No rash noted. She is not diaphoretic. No erythema. No pallor.  Psychiatric: She has a normal mood and affect. Her  behavior is normal. Judgment and thought content normal.          Assessment & Plan:

## 2013-08-17 LAB — HEPATIC FUNCTION PANEL A (ARMC)
Albumin: 2.8 g/dL — ABNORMAL LOW (ref 3.4–5.0)
Alkaline Phosphatase: 91 U/L
Bilirubin, Direct: 0.1 mg/dL (ref 0.00–0.20)
Bilirubin,Total: 0.4 mg/dL (ref 0.2–1.0)
SGOT(AST): 37 U/L (ref 15–37)
Total Protein: 7 g/dL (ref 6.4–8.2)

## 2013-08-17 LAB — CANCER CENTER HEMATOCRIT: HCT: 49.5 % — ABNORMAL HIGH (ref 35.0–47.0)

## 2013-08-22 ENCOUNTER — Ambulatory Visit: Payer: Self-pay | Admitting: Internal Medicine

## 2013-08-24 LAB — CBC CANCER CENTER
Basophil #: 0.1 x10 3/mm (ref 0.0–0.1)
Basophil %: 0.7 %
Lymphocyte %: 22.5 %
MCH: 28.4 pg (ref 26.0–34.0)
MCHC: 32.6 g/dL (ref 32.0–36.0)
Monocyte #: 0.4 x10 3/mm (ref 0.2–0.9)
Neutrophil #: 5.5 x10 3/mm (ref 1.4–6.5)
Neutrophil %: 69.6 %
Platelet: 391 x10 3/mm (ref 150–440)
WBC: 7.9 x10 3/mm (ref 3.6–11.0)

## 2013-08-24 LAB — HEPATIC FUNCTION PANEL A (ARMC)
Bilirubin,Total: 0.6 mg/dL (ref 0.2–1.0)
SGOT(AST): 52 U/L — ABNORMAL HIGH (ref 15–37)
SGPT (ALT): 49 U/L (ref 12–78)
Total Protein: 7.9 g/dL (ref 6.4–8.2)

## 2013-08-24 LAB — CREATININE, SERUM: Creatinine: 0.78 mg/dL (ref 0.60–1.30)

## 2013-09-07 LAB — CANCER CENTER HEMATOCRIT: HCT: 47.6 % — ABNORMAL HIGH (ref 35.0–47.0)

## 2013-09-19 LAB — CBC CANCER CENTER
Basophil %: 0.9 %
Eosinophil %: 1.8 %
MCH: 27.4 pg (ref 26.0–34.0)
MCHC: 32.4 g/dL (ref 32.0–36.0)
Monocyte %: 4.9 %
Neutrophil #: 4.8 x10 3/mm (ref 1.4–6.5)
Neutrophil %: 67.2 %
Platelet: 342 x10 3/mm (ref 150–440)
RBC: 5.86 10*6/uL — ABNORMAL HIGH (ref 3.80–5.20)
WBC: 7.2 x10 3/mm (ref 3.6–11.0)

## 2013-09-19 LAB — CREATININE, SERUM
EGFR (African American): >60
EGFR (Non-African Amer.): >60

## 2013-09-19 LAB — HEPATIC FUNCTION PANEL A (ARMC)
Albumin: 3.1 g/dL — ABNORMAL LOW (ref 3.4–5.0)
Alkaline Phosphatase: 102 U/L
Bilirubin, Direct: 0.1 mg/dL (ref 0.00–0.20)
SGOT(AST): 57 U/L — ABNORMAL HIGH (ref 15–37)
SGPT (ALT): 51 U/L (ref 12–78)

## 2013-09-22 ENCOUNTER — Ambulatory Visit: Payer: Self-pay | Admitting: Internal Medicine

## 2013-09-30 LAB — HEPATIC FUNCTION PANEL A (ARMC)
ALK PHOS: 106 U/L
ALT: 53 U/L (ref 12–78)
Albumin: 3.2 g/dL — ABNORMAL LOW (ref 3.4–5.0)
BILIRUBIN DIRECT: 0.1 mg/dL (ref 0.00–0.20)
BILIRUBIN TOTAL: 0.5 mg/dL (ref 0.2–1.0)
SGOT(AST): 48 U/L — ABNORMAL HIGH (ref 15–37)
Total Protein: 7.5 g/dL (ref 6.4–8.2)

## 2013-09-30 LAB — CANCER CENTER HEMATOCRIT: HCT: 50.3 % — AB (ref 35.0–47.0)

## 2013-10-03 LAB — AFP TUMOR MARKER

## 2013-10-12 LAB — CBC CANCER CENTER
Basophil #: 0.1 x10 3/mm (ref 0.0–0.1)
Basophil %: 0.8 %
EOS PCT: 2.1 %
Eosinophil #: 0.2 x10 3/mm (ref 0.0–0.7)
HCT: 47 % (ref 35.0–47.0)
HGB: 15.3 g/dL (ref 12.0–16.0)
LYMPHS ABS: 1.9 x10 3/mm (ref 1.0–3.6)
Lymphocyte %: 19.7 %
MCH: 27.3 pg (ref 26.0–34.0)
MCHC: 32.6 g/dL (ref 32.0–36.0)
MCV: 84 fL (ref 80–100)
MONO ABS: 0.5 x10 3/mm (ref 0.2–0.9)
Monocyte %: 5.7 %
Neutrophil #: 6.8 x10 3/mm — ABNORMAL HIGH (ref 1.4–6.5)
Neutrophil %: 71.7 %
Platelet: 402 x10 3/mm (ref 150–440)
RBC: 5.61 10*6/uL — ABNORMAL HIGH (ref 3.80–5.20)
RDW: 16 % — ABNORMAL HIGH (ref 11.5–14.5)
WBC: 9.4 x10 3/mm (ref 3.6–11.0)

## 2013-10-12 LAB — HEPATIC FUNCTION PANEL A (ARMC)
Albumin: 3 g/dL — ABNORMAL LOW (ref 3.4–5.0)
Alkaline Phosphatase: 103 U/L
BILIRUBIN TOTAL: 0.6 mg/dL (ref 0.2–1.0)
Bilirubin, Direct: 0.1 mg/dL (ref 0.00–0.20)
SGOT(AST): 51 U/L — ABNORMAL HIGH (ref 15–37)
SGPT (ALT): 51 U/L (ref 12–78)
Total Protein: 7.5 g/dL (ref 6.4–8.2)

## 2013-10-12 LAB — CREATININE, SERUM
CREATININE: 0.64 mg/dL (ref 0.60–1.30)
EGFR (African American): >60
EGFR (Non-African Amer.): >60

## 2013-10-18 ENCOUNTER — Other Ambulatory Visit (HOSPITAL_COMMUNITY): Payer: Self-pay | Admitting: Internal Medicine

## 2013-10-23 ENCOUNTER — Ambulatory Visit: Payer: Self-pay | Admitting: Internal Medicine

## 2013-10-26 LAB — HEPATIC FUNCTION PANEL A (ARMC)
ALBUMIN: 3 g/dL — AB (ref 3.4–5.0)
ALK PHOS: 109 U/L
Bilirubin, Direct: 0.1 mg/dL (ref 0.00–0.20)
Bilirubin,Total: 0.4 mg/dL (ref 0.2–1.0)
SGOT(AST): 57 U/L — ABNORMAL HIGH (ref 15–37)
SGPT (ALT): 58 U/L (ref 12–78)
TOTAL PROTEIN: 7.5 g/dL (ref 6.4–8.2)

## 2013-10-26 LAB — CANCER CENTER HEMATOCRIT: HCT: 48.2 % — AB (ref 35.0–47.0)

## 2013-10-28 LAB — AFP TUMOR MARKER

## 2013-11-20 ENCOUNTER — Ambulatory Visit: Payer: Self-pay | Admitting: Internal Medicine

## 2013-11-22 ENCOUNTER — Ambulatory Visit (INDEPENDENT_AMBULATORY_CARE_PROVIDER_SITE_OTHER): Payer: 59 | Admitting: Internal Medicine

## 2013-11-22 ENCOUNTER — Encounter: Payer: Self-pay | Admitting: Internal Medicine

## 2013-11-22 VITALS — BP 160/90 | HR 70 | Temp 97.7°F | Wt 167.0 lb

## 2013-11-22 DIAGNOSIS — I10 Essential (primary) hypertension: Secondary | ICD-10-CM

## 2013-11-22 DIAGNOSIS — C229 Malignant neoplasm of liver, not specified as primary or secondary: Secondary | ICD-10-CM

## 2013-11-22 DIAGNOSIS — Z634 Disappearance and death of family member: Secondary | ICD-10-CM

## 2013-11-22 NOTE — Progress Notes (Signed)
Pre visit review using our clinic review tool, if applicable. No additional management support is needed unless otherwise documented below in the visit note. 

## 2013-11-22 NOTE — Progress Notes (Signed)
   Subjective:    Patient ID: Chelsea Howard, female    DOB: 10/26/1931, 80 y.o.   MRN: [id number]  HPI 80YO female with lhepatocellular carcinoma presents for follow up.  Hepatocellular carcinoma - continues on Nexavar. Dosing recently adjusted by Dr. Cynda Acres. Seen by Dr. Cynda Acres monthly.  Tearful today, as friend in hospital, doing poorly.  HTN - SBP has been near 130-140s mostly. Compliant with Quinapril and Amlodipine.  Review of Systems  Constitutional: Negative for fever, chills, appetite change, fatigue and unexpected weight change.  HENT: Negative for congestion, ear pain, sinus pressure, sore throat, trouble swallowing and voice change.   Eyes: Negative for visual disturbance.  Respiratory: Negative for cough, shortness of breath, wheezing and stridor.   Cardiovascular: Negative for chest pain, palpitations and leg swelling.  Gastrointestinal: Negative for nausea, vomiting, abdominal pain, diarrhea, constipation, blood in stool, abdominal distention and anal bleeding.  Genitourinary: Negative for dysuria and flank pain.  Musculoskeletal: Negative for arthralgias, gait problem, myalgias and neck pain.  Skin: Negative for color change and rash.  Neurological: Negative for dizziness and headaches.  Hematological: Negative for adenopathy. Does not bruise/bleed easily.  Psychiatric/Behavioral: Positive for dysphoric mood. Negative for suicidal ideas and sleep disturbance. The patient is nervous/anxious.        Objective:    BP 160/90  Pulse 70  Temp(Src) 97.7 F (36.5 C) (Oral)  Wt 167 lb (75.751 kg)  SpO2 99% Physical Exam  Constitutional: She is oriented to person, place, and time. She appears well-developed and well-nourished. No distress.  HENT:  Head: Normocephalic and atraumatic.  Right Ear: External ear normal.  Left Ear: External ear normal.  Nose: Nose normal.  Mouth/Throat: Oropharynx is clear and moist. No oropharyngeal exudate.  Eyes: Conjunctivae are  normal. Pupils are equal, round, and reactive to light. Right eye exhibits no discharge. Left eye exhibits no discharge. No scleral icterus.  Neck: Normal range of motion. Neck supple. No tracheal deviation present. No thyromegaly present.  Cardiovascular: Normal rate, regular rhythm, normal heart sounds and intact distal pulses.  Exam reveals no gallop and no friction rub.   No murmur heard. Pulmonary/Chest: Effort normal and breath sounds normal. No accessory muscle usage. Not tachypneic. No respiratory distress. She has no decreased breath sounds. She has no wheezes. She has no rhonchi. She has no rales. She exhibits no tenderness.  Musculoskeletal: Normal range of motion. She exhibits no edema and no tenderness.  Lymphadenopathy:    She has no cervical adenopathy.  Neurological: She is alert and oriented to person, place, and time. No cranial nerve deficit. She exhibits normal muscle tone. Coordination normal.  Skin: Skin is warm and dry. No rash noted. She is not diaphoretic. No erythema. No pallor.  Psychiatric: Her speech is normal and behavior is normal. Judgment and thought content normal. Her mood appears anxious. She exhibits a depressed mood.          Assessment & Plan:   Problem List Items Addressed This Visit   None       No Follow-up on file.

## 2013-11-22 NOTE — Assessment & Plan Note (Signed)
BP Readings from Last 3 Encounters:  11/22/13 160/90  08/15/13 136/70  07/08/13 160/90   BP elevated today however pt tearful. Reportedly, BP well controlled at home. Will continue Quinapril and Amlodipine for now. Pt will call if BP at home >140/90. Follow up 3 months and prn.

## 2013-11-22 NOTE — Assessment & Plan Note (Signed)
Pt tearful today describing her friend's recent illness. Offered support. Discussed using alprazolam to help with anxiety if symptoms severe or persistent.

## 2013-11-22 NOTE — Assessment & Plan Note (Signed)
Continues on Nexavar. Will request recent notes and labs from Dr. Cynda Acres.

## 2013-11-23 ENCOUNTER — Telehealth: Payer: Self-pay | Admitting: Internal Medicine

## 2013-11-23 DIAGNOSIS — I10 Essential (primary) hypertension: Secondary | ICD-10-CM

## 2013-11-23 LAB — CREATININE, SERUM
Creatinine: 0.72 mg/dL (ref 0.60–1.30)
EGFR (African American): >60

## 2013-11-23 LAB — CBC CANCER CENTER
BASOS PCT: 0.9 %
Basophil #: 0.1 x10 3/mm (ref 0.0–0.1)
EOS PCT: 4.4 %
Eosinophil #: 0.3 x10 3/mm (ref 0.0–0.7)
HCT: 48.1 % — AB (ref 35.0–47.0)
HGB: 15.4 g/dL (ref 12.0–16.0)
LYMPHS ABS: 1.9 x10 3/mm (ref 1.0–3.6)
LYMPHS PCT: 29.6 %
MCH: 26.7 pg (ref 26.0–34.0)
MCHC: 32 g/dL (ref 32.0–36.0)
MCV: 83 fL (ref 80–100)
Monocyte #: 0.3 x10 3/mm (ref 0.2–0.9)
Monocyte %: 5 %
NEUTROS ABS: 3.9 x10 3/mm (ref 1.4–6.5)
Neutrophil %: 60.1 %
Platelet: 340 x10 3/mm (ref 150–440)
RBC: 5.77 10*6/uL — ABNORMAL HIGH (ref 3.80–5.20)
RDW: 16.8 % — ABNORMAL HIGH (ref 11.5–14.5)
WBC: 6.4 x10 3/mm (ref 3.6–11.0)

## 2013-11-23 LAB — HEPATIC FUNCTION PANEL A (ARMC)
ALBUMIN: 3 g/dL — AB (ref 3.4–5.0)
Alkaline Phosphatase: 103 U/L
BILIRUBIN DIRECT: 0.1 mg/dL (ref 0.00–0.20)
Bilirubin,Total: 0.5 mg/dL (ref 0.2–1.0)
SGOT(AST): 51 U/L — ABNORMAL HIGH (ref 15–37)
SGPT (ALT): 41 U/L (ref 12–78)
TOTAL PROTEIN: 7.4 g/dL (ref 6.4–8.2)

## 2013-11-23 NOTE — Telephone Encounter (Signed)
Relevant patient education mailed to patient.  

## 2013-12-02 LAB — HEPATIC FUNCTION PANEL A (ARMC)
ALK PHOS: 120 U/L — AB
Albumin: 3.2 g/dL — ABNORMAL LOW (ref 3.4–5.0)
BILIRUBIN TOTAL: 0.7 mg/dL (ref 0.2–1.0)
Bilirubin, Direct: 0.2 mg/dL (ref 0.00–0.20)
SGOT(AST): 67 U/L — ABNORMAL HIGH (ref 15–37)
SGPT (ALT): 46 U/L (ref 12–78)
Total Protein: 8 g/dL (ref 6.4–8.2)

## 2013-12-08 LAB — HEPATIC FUNCTION PANEL A (ARMC)
Albumin: 2.9 g/dL — ABNORMAL LOW (ref 3.4–5.0)
Alkaline Phosphatase: 105 U/L
Bilirubin, Direct: 0.2 mg/dL (ref 0.00–0.20)
Bilirubin,Total: 0.7 mg/dL (ref 0.2–1.0)
SGOT(AST): 66 U/L — ABNORMAL HIGH (ref 15–37)
SGPT (ALT): 39 U/L (ref 12–78)
Total Protein: 7.3 g/dL (ref 6.4–8.2)

## 2013-12-08 LAB — CANCER CENTER HEMATOCRIT: HCT: 49 % — ABNORMAL HIGH (ref 35.0–47.0)

## 2013-12-15 LAB — HEPATIC FUNCTION PANEL A (ARMC)
ALBUMIN: 2.9 g/dL — AB (ref 3.4–5.0)
Alkaline Phosphatase: 113 U/L
BILIRUBIN DIRECT: 0.1 mg/dL (ref 0.00–0.20)
Bilirubin,Total: 0.5 mg/dL (ref 0.2–1.0)
SGOT(AST): 54 U/L — ABNORMAL HIGH (ref 15–37)
SGPT (ALT): 45 U/L (ref 12–78)
Total Protein: 7.5 g/dL (ref 6.4–8.2)

## 2013-12-15 LAB — HEMATOCRIT: HCT: 49.9 % — ABNORMAL HIGH (ref 35.0–47.0)

## 2013-12-21 ENCOUNTER — Ambulatory Visit: Payer: Self-pay | Admitting: Internal Medicine

## 2013-12-23 LAB — CBC CANCER CENTER
BASOS ABS: 0.1 x10 3/mm (ref 0.0–0.1)
BASOS PCT: 0.9 %
EOS PCT: 3.6 %
Eosinophil #: 0.3 x10 3/mm (ref 0.0–0.7)
HCT: 49.8 % — ABNORMAL HIGH (ref 35.0–47.0)
HGB: 15.6 g/dL (ref 12.0–16.0)
LYMPHS ABS: 1.8 x10 3/mm (ref 1.0–3.6)
LYMPHS PCT: 23.3 %
MCH: 26 pg (ref 26.0–34.0)
MCHC: 31.3 g/dL — AB (ref 32.0–36.0)
MCV: 83 fL (ref 80–100)
Monocyte #: 0.3 x10 3/mm (ref 0.2–0.9)
Monocyte %: 4.5 %
Neutrophil #: 5.1 x10 3/mm (ref 1.4–6.5)
Neutrophil %: 67.7 %
Platelet: 333 x10 3/mm (ref 150–440)
RBC: 6.01 10*6/uL — ABNORMAL HIGH (ref 3.80–5.20)
RDW: 17.4 % — AB (ref 11.5–14.5)
WBC: 7.6 x10 3/mm (ref 3.6–11.0)

## 2013-12-23 LAB — CK: CK, Total: 71 U/L

## 2013-12-23 LAB — HEPATIC FUNCTION PANEL A (ARMC)
ALK PHOS: 118 U/L — AB
ALT: 44 U/L (ref 12–78)
Albumin: 2.9 g/dL — ABNORMAL LOW (ref 3.4–5.0)
Bilirubin, Direct: 0.1 mg/dL (ref 0.00–0.20)
Bilirubin,Total: 0.6 mg/dL (ref 0.2–1.0)
SGOT(AST): 61 U/L — ABNORMAL HIGH (ref 15–37)
TOTAL PROTEIN: 7.4 g/dL (ref 6.4–8.2)

## 2013-12-23 LAB — CREATININE, SERUM
Creatinine: 0.69 mg/dL (ref 0.60–1.30)
EGFR (African American): >60
EGFR (Non-African Amer.): >60

## 2013-12-26 LAB — AFP TUMOR MARKER

## 2014-01-05 LAB — CREATININE, SERUM
CREATININE: 0.67 mg/dL (ref 0.60–1.30)
EGFR (African American): >60

## 2014-01-05 LAB — HEPATIC FUNCTION PANEL A (ARMC)
ALBUMIN: 3 g/dL — AB (ref 3.4–5.0)
ALK PHOS: 129 U/L — AB
BILIRUBIN TOTAL: 0.7 mg/dL (ref 0.2–1.0)
Bilirubin, Direct: 0.1 mg/dL (ref 0.00–0.20)
SGOT(AST): 51 U/L — ABNORMAL HIGH (ref 15–37)
SGPT (ALT): 26 U/L (ref 12–78)
TOTAL PROTEIN: 8.2 g/dL (ref 6.4–8.2)

## 2014-01-05 LAB — CANCER CENTER HEMATOCRIT: HCT: 52.4 % — ABNORMAL HIGH (ref 35.0–47.0)

## 2014-01-19 LAB — HEPATIC FUNCTION PANEL A (ARMC)
ALBUMIN: 3.1 g/dL — AB (ref 3.4–5.0)
ALK PHOS: 136 U/L — AB
ALT: 35 U/L (ref 12–78)
Bilirubin, Direct: 0.1 mg/dL (ref 0.00–0.20)
Bilirubin,Total: 0.6 mg/dL (ref 0.2–1.0)
SGOT(AST): 72 U/L — ABNORMAL HIGH (ref 15–37)
Total Protein: 8.1 g/dL (ref 6.4–8.2)

## 2014-01-19 LAB — CBC CANCER CENTER
BASOS PCT: 1.7 %
Basophil #: 0.1 x10 3/mm (ref 0.0–0.1)
EOS ABS: 0.3 x10 3/mm (ref 0.0–0.7)
Eosinophil %: 3.1 %
HCT: 51.3 % — ABNORMAL HIGH (ref 35.0–47.0)
HGB: 16.7 g/dL — ABNORMAL HIGH (ref 12.0–16.0)
LYMPHS ABS: 2 x10 3/mm (ref 1.0–3.6)
LYMPHS PCT: 23.7 %
MCH: 26.4 pg (ref 26.0–34.0)
MCHC: 32.6 g/dL (ref 32.0–36.0)
MCV: 81 fL (ref 80–100)
Monocyte #: 0.3 x10 3/mm (ref 0.2–0.9)
Monocyte %: 3.9 %
NEUTROS PCT: 67.6 %
Neutrophil #: 5.9 x10 3/mm (ref 1.4–6.5)
PLATELETS: 467 x10 3/mm — AB (ref 150–440)
RBC: 6.33 10*6/uL — AB (ref 3.80–5.20)
RDW: 17.5 % — ABNORMAL HIGH (ref 11.5–14.5)
WBC: 8.7 x10 3/mm (ref 3.6–11.0)

## 2014-01-19 LAB — MAGNESIUM: Magnesium: 1.8 mg/dL

## 2014-01-19 LAB — CREATININE, SERUM: Creatinine: 0.76 mg/dL (ref 0.60–1.30)

## 2014-01-20 ENCOUNTER — Ambulatory Visit: Payer: Self-pay | Admitting: Internal Medicine

## 2014-01-30 LAB — HEPATIC FUNCTION PANEL A (ARMC)
ALBUMIN: 2.9 g/dL — AB (ref 3.4–5.0)
AST: 74 U/L — AB (ref 15–37)
Alkaline Phosphatase: 109 U/L
Bilirubin, Direct: 0.1 mg/dL (ref 0.00–0.20)
Bilirubin,Total: 0.4 mg/dL (ref 0.2–1.0)
SGPT (ALT): 27 U/L (ref 12–78)
TOTAL PROTEIN: 7.3 g/dL (ref 6.4–8.2)

## 2014-02-02 ENCOUNTER — Other Ambulatory Visit: Payer: Self-pay | Admitting: Internal Medicine

## 2014-02-02 LAB — AFP TUMOR MARKER

## 2014-02-10 LAB — HEPATIC FUNCTION PANEL A (ARMC)
ALK PHOS: 143 U/L — AB
Albumin: 2.5 g/dL — ABNORMAL LOW (ref 3.4–5.0)
BILIRUBIN TOTAL: 0.5 mg/dL (ref 0.2–1.0)
Bilirubin, Direct: 0.1 mg/dL (ref 0.00–0.20)
SGOT(AST): 61 U/L — ABNORMAL HIGH (ref 15–37)
SGPT (ALT): 27 U/L (ref 12–78)
TOTAL PROTEIN: 7.8 g/dL (ref 6.4–8.2)

## 2014-02-10 LAB — CANCER CENTER HEMATOCRIT: HCT: 42.5 % (ref 35.0–47.0)

## 2014-02-19 ENCOUNTER — Other Ambulatory Visit (HOSPITAL_COMMUNITY): Payer: Self-pay | Admitting: Internal Medicine

## 2014-02-20 ENCOUNTER — Ambulatory Visit: Payer: Self-pay | Admitting: Internal Medicine

## 2014-02-20 LAB — HEPATIC FUNCTION PANEL A (ARMC)
ALBUMIN: 2.7 g/dL — AB (ref 3.4–5.0)
ALT: 31 U/L (ref 12–78)
AST: 67 U/L — AB (ref 15–37)
Alkaline Phosphatase: 143 U/L — ABNORMAL HIGH
BILIRUBIN TOTAL: 0.4 mg/dL (ref 0.2–1.0)
Bilirubin, Direct: 0.1 mg/dL (ref 0.00–0.20)
Total Protein: 7.7 g/dL (ref 6.4–8.2)

## 2014-02-21 ENCOUNTER — Ambulatory Visit: Payer: 59 | Admitting: Internal Medicine

## 2014-02-23 LAB — AFP TUMOR MARKER

## 2014-03-06 LAB — HEPATIC FUNCTION PANEL A (ARMC)
ALBUMIN: 3 g/dL — AB (ref 3.4–5.0)
Alkaline Phosphatase: 145 U/L — ABNORMAL HIGH
BILIRUBIN DIRECT: 0.1 mg/dL (ref 0.00–0.20)
Bilirubin,Total: 0.7 mg/dL (ref 0.2–1.0)
SGOT(AST): 87 U/L — ABNORMAL HIGH (ref 15–37)
SGPT (ALT): 43 U/L (ref 12–78)
TOTAL PROTEIN: 8 g/dL (ref 6.4–8.2)

## 2014-03-06 LAB — CANCER CENTER HEMATOCRIT: HCT: 50.2 % — AB (ref 35.0–47.0)

## 2014-03-20 LAB — CBC CANCER CENTER
Basophil #: 0.1 x10 3/mm (ref 0.0–0.1)
Basophil %: 0.8 %
Eosinophil #: 0.3 x10 3/mm (ref 0.0–0.7)
Eosinophil %: 3.4 %
HCT: 49.7 % — AB (ref 35.0–47.0)
HGB: 15.9 g/dL (ref 12.0–16.0)
LYMPHS PCT: 26 %
Lymphocyte #: 2.1 x10 3/mm (ref 1.0–3.6)
MCH: 25.9 pg — AB (ref 26.0–34.0)
MCHC: 32 g/dL (ref 32.0–36.0)
MCV: 81 fL (ref 80–100)
Monocyte #: 0.4 x10 3/mm (ref 0.2–0.9)
Monocyte %: 4.8 %
NEUTROS PCT: 65 %
Neutrophil #: 5.2 x10 3/mm (ref 1.4–6.5)
Platelet: 435 x10 3/mm (ref 150–440)
RBC: 6.15 10*6/uL — AB (ref 3.80–5.20)
RDW: 18.9 % — AB (ref 11.5–14.5)
WBC: 7.9 x10 3/mm (ref 3.6–11.0)

## 2014-03-20 LAB — HEPATIC FUNCTION PANEL A (ARMC)
ALBUMIN: 3 g/dL — AB (ref 3.4–5.0)
Alkaline Phosphatase: 147 U/L — ABNORMAL HIGH
BILIRUBIN TOTAL: 0.6 mg/dL (ref 0.2–1.0)
Bilirubin, Direct: 0.1 mg/dL (ref 0.00–0.20)
SGOT(AST): 88 U/L — ABNORMAL HIGH (ref 15–37)
SGPT (ALT): 52 U/L (ref 12–78)
Total Protein: 8.4 g/dL — ABNORMAL HIGH (ref 6.4–8.2)

## 2014-03-21 LAB — AFP TUMOR MARKER

## 2014-03-22 ENCOUNTER — Ambulatory Visit: Payer: Self-pay | Admitting: Internal Medicine

## 2014-03-23 ENCOUNTER — Ambulatory Visit: Payer: 59 | Admitting: Internal Medicine

## 2014-03-30 LAB — HEPATIC FUNCTION PANEL A (ARMC)
ALBUMIN: 2.9 g/dL — AB (ref 3.4–5.0)
AST: 75 U/L — AB (ref 15–37)
Alkaline Phosphatase: 148 U/L — ABNORMAL HIGH
Bilirubin, Direct: 0.1 mg/dL (ref 0.00–0.20)
Bilirubin,Total: 0.5 mg/dL (ref 0.2–1.0)
SGPT (ALT): 46 U/L (ref 12–78)
TOTAL PROTEIN: 7.8 g/dL (ref 6.4–8.2)

## 2014-04-17 LAB — POTASSIUM: Potassium: 3.9 mmol/L (ref 3.5–5.1)

## 2014-04-17 LAB — HEPATIC FUNCTION PANEL A (ARMC)
ALK PHOS: 174 U/L — AB
AST: 57 U/L — AB (ref 15–37)
Albumin: 2.5 g/dL — ABNORMAL LOW (ref 3.4–5.0)
Bilirubin, Direct: 0.1 mg/dL (ref 0.00–0.20)
Bilirubin,Total: 0.4 mg/dL (ref 0.2–1.0)
SGPT (ALT): 25 U/L
Total Protein: 7.8 g/dL (ref 6.4–8.2)

## 2014-04-17 LAB — CANCER CENTER HEMATOCRIT: HCT: 43.7 % (ref 35.0–47.0)

## 2014-04-17 LAB — CREATININE, SERUM
Creatinine: 0.81 mg/dL (ref 0.60–1.30)
EGFR (African American): >60
EGFR (Non-African Amer.): >60

## 2014-04-22 ENCOUNTER — Ambulatory Visit: Payer: Self-pay | Admitting: Internal Medicine

## 2014-04-24 ENCOUNTER — Inpatient Hospital Stay: Payer: Self-pay | Admitting: Internal Medicine

## 2014-04-24 LAB — URINALYSIS, COMPLETE
Bacteria: NONE SEEN
Blood: NEGATIVE
Glucose,UR: 50 mg/dL (ref 0–75)
Leukocyte Esterase: NEGATIVE
NITRITE: NEGATIVE
Ph: 5 (ref 4.5–8.0)
Protein: 30
RBC,UR: 31 /HPF (ref 0–5)
Specific Gravity: 1.017 (ref 1.003–1.030)
Squamous Epithelial: NONE SEEN
WBC UR: NONE SEEN /HPF (ref 0–5)

## 2014-04-24 LAB — TROPONIN I: Troponin-I: <0.02 ng/mL

## 2014-04-24 LAB — COMPREHENSIVE METABOLIC PANEL
ALBUMIN: 2.2 g/dL — AB (ref 3.4–5.0)
ANION GAP: 11 (ref 7–16)
Alkaline Phosphatase: 447 U/L — ABNORMAL HIGH
BUN: 13 mg/dL (ref 7–18)
Bilirubin,Total: 4.8 mg/dL — ABNORMAL HIGH (ref 0.2–1.0)
CHLORIDE: 96 mmol/L — AB (ref 98–107)
Calcium, Total: 8.6 mg/dL (ref 8.5–10.1)
Co2: 22 mmol/L (ref 21–32)
Creatinine: 1 mg/dL (ref 0.60–1.30)
EGFR (African American): >60
GFR CALC NON AF AMER: 53 — AB
Glucose: 157 mg/dL — ABNORMAL HIGH (ref 65–99)
Osmolality: 262 (ref 275–301)
Potassium: 3.7 mmol/L (ref 3.5–5.1)
SGOT(AST): 246 U/L — ABNORMAL HIGH (ref 15–37)
SGPT (ALT): 128 U/L — ABNORMAL HIGH
Sodium: 129 mmol/L — ABNORMAL LOW (ref 136–145)
TOTAL PROTEIN: 7.5 g/dL (ref 6.4–8.2)

## 2014-04-24 LAB — CBC WITH DIFFERENTIAL/PLATELET
Basophil #: 0 10*3/uL (ref 0.0–0.1)
Basophil %: 0.1 %
Eosinophil #: 0 10*3/uL (ref 0.0–0.7)
Eosinophil %: 0 %
HCT: 42.7 % (ref 35.0–47.0)
HGB: 13.6 g/dL (ref 12.0–16.0)
Lymphocyte #: 0.2 10*3/uL — ABNORMAL LOW (ref 1.0–3.6)
Lymphocyte %: 1.1 %
MCH: 25.6 pg — ABNORMAL LOW (ref 26.0–34.0)
MCHC: 31.8 g/dL — ABNORMAL LOW (ref 32.0–36.0)
MCV: 81 fL (ref 80–100)
MONOS PCT: 1.2 %
Monocyte #: 0.3 x10 3/mm (ref 0.2–0.9)
NEUTROS ABS: 21.6 10*3/uL — AB (ref 1.4–6.5)
NEUTROS PCT: 97.6 %
PLATELETS: 527 10*3/uL — AB (ref 150–440)
RBC: 5.3 10*6/uL — ABNORMAL HIGH (ref 3.80–5.20)
RDW: 19 % — AB (ref 11.5–14.5)
WBC: 22.2 10*3/uL — ABNORMAL HIGH (ref 3.6–11.0)

## 2014-04-24 LAB — PHOSPHORUS: Phosphorus: 2.8 mg/dL (ref 2.5–4.9)

## 2014-04-24 LAB — LIPASE, BLOOD: Lipase: 106 U/L (ref 73–393)

## 2014-04-24 LAB — PROTIME-INR
INR: 1.2
Prothrombin Time: 15.4 secs — ABNORMAL HIGH (ref 11.5–14.7)

## 2014-04-24 LAB — MAGNESIUM: MAGNESIUM: 1.1 mg/dL — AB

## 2014-04-25 LAB — BASIC METABOLIC PANEL
Anion Gap: 5 — ABNORMAL LOW (ref 7–16)
BUN: 13 mg/dL (ref 7–18)
Calcium, Total: 7.9 mg/dL — ABNORMAL LOW (ref 8.5–10.1)
Chloride: 101 mmol/L (ref 98–107)
Co2: 25 mmol/L (ref 21–32)
Creatinine: 0.8 mg/dL (ref 0.60–1.30)
EGFR (Non-African Amer.): >60
Glucose: 106 mg/dL — ABNORMAL HIGH (ref 65–99)
Osmolality: 263 (ref 275–301)
Potassium: 3.8 mmol/L (ref 3.5–5.1)
Sodium: 131 mmol/L — ABNORMAL LOW (ref 136–145)

## 2014-04-25 LAB — CBC WITH DIFFERENTIAL/PLATELET
BASOS PCT: 0.8 %
Basophil #: 0.2 10*3/uL — ABNORMAL HIGH (ref 0.0–0.1)
Eosinophil #: 0 10*3/uL (ref 0.0–0.7)
Eosinophil %: 0.1 %
HCT: 40.1 % (ref 35.0–47.0)
HGB: 12.7 g/dL (ref 12.0–16.0)
Lymphocyte #: 1.3 10*3/uL (ref 1.0–3.6)
Lymphocyte %: 4.4 %
MCH: 25.8 pg — ABNORMAL LOW (ref 26.0–34.0)
MCHC: 31.7 g/dL — ABNORMAL LOW (ref 32.0–36.0)
MCV: 81 fL (ref 80–100)
MONOS PCT: 4.9 %
Monocyte #: 1.4 x10 3/mm — ABNORMAL HIGH (ref 0.2–0.9)
NEUTROS ABS: 25.5 10*3/uL — AB (ref 1.4–6.5)
Neutrophil %: 89.8 %
Platelet: 401 10*3/uL (ref 150–440)
RBC: 4.94 10*6/uL (ref 3.80–5.20)
RDW: 19.2 % — AB (ref 11.5–14.5)
WBC: 28.4 10*3/uL — ABNORMAL HIGH (ref 3.6–11.0)

## 2014-04-26 LAB — CBC WITH DIFFERENTIAL/PLATELET
BASOS ABS: 0.1 10*3/uL (ref 0.0–0.1)
Basophil %: 0.5 %
EOS PCT: 0.3 %
Eosinophil #: 0 10*3/uL (ref 0.0–0.7)
HCT: 40.3 % (ref 35.0–47.0)
HGB: 12.3 g/dL (ref 12.0–16.0)
Lymphocyte #: 0.9 10*3/uL — ABNORMAL LOW (ref 1.0–3.6)
Lymphocyte %: 4.8 %
MCH: 24.7 pg — ABNORMAL LOW (ref 26.0–34.0)
MCHC: 30.6 g/dL — AB (ref 32.0–36.0)
MCV: 81 fL (ref 80–100)
MONO ABS: 1 x10 3/mm — AB (ref 0.2–0.9)
Monocyte %: 5.7 %
NEUTROS ABS: 16.4 10*3/uL — AB (ref 1.4–6.5)
Neutrophil %: 88.7 %
PLATELETS: 417 10*3/uL (ref 150–440)
RBC: 5 10*6/uL (ref 3.80–5.20)
RDW: 19.2 % — AB (ref 11.5–14.5)
WBC: 18.4 10*3/uL — AB (ref 3.6–11.0)

## 2014-04-26 LAB — COMPREHENSIVE METABOLIC PANEL
AST: 88 U/L — AB (ref 15–37)
Albumin: 1.9 g/dL — ABNORMAL LOW (ref 3.4–5.0)
Alkaline Phosphatase: 266 U/L — ABNORMAL HIGH
Anion Gap: 8 (ref 7–16)
BUN: 9 mg/dL (ref 7–18)
Bilirubin,Total: 2.2 mg/dL — ABNORMAL HIGH (ref 0.2–1.0)
Calcium, Total: 8.1 mg/dL — ABNORMAL LOW (ref 8.5–10.1)
Chloride: 99 mmol/L (ref 98–107)
Co2: 26 mmol/L (ref 21–32)
Creatinine: 0.79 mg/dL (ref 0.60–1.30)
EGFR (African American): >60
Glucose: 168 mg/dL — ABNORMAL HIGH (ref 65–99)
Osmolality: 269 (ref 275–301)
POTASSIUM: 3.5 mmol/L (ref 3.5–5.1)
SGPT (ALT): 96 U/L — ABNORMAL HIGH
SODIUM: 133 mmol/L — AB (ref 136–145)
TOTAL PROTEIN: 6.5 g/dL (ref 6.4–8.2)

## 2014-04-26 LAB — MAGNESIUM: Magnesium: 1.9 mg/dL

## 2014-04-27 LAB — BASIC METABOLIC PANEL
Anion Gap: 9 (ref 7–16)
BUN: 5 mg/dL — ABNORMAL LOW (ref 7–18)
CALCIUM: 8 mg/dL — AB (ref 8.5–10.1)
CREATININE: 0.7 mg/dL (ref 0.60–1.30)
Chloride: 102 mmol/L (ref 98–107)
Co2: 24 mmol/L (ref 21–32)
EGFR (African American): >60
EGFR (Non-African Amer.): >60
GLUCOSE: 93 mg/dL (ref 65–99)
Osmolality: 267 (ref 275–301)
Potassium: 3.4 mmol/L — ABNORMAL LOW (ref 3.5–5.1)
SODIUM: 135 mmol/L — AB (ref 136–145)

## 2014-04-27 LAB — CULTURE, BLOOD (SINGLE)

## 2014-04-27 LAB — WBC: WBC: 17.1 10*3/uL — AB (ref 3.6–11.0)

## 2014-04-28 ENCOUNTER — Ambulatory Visit: Payer: 59 | Admitting: Internal Medicine

## 2014-04-28 LAB — CULTURE, BLOOD (SINGLE)

## 2014-05-03 ENCOUNTER — Telehealth: Payer: Self-pay | Admitting: *Deleted

## 2014-05-03 ENCOUNTER — Ambulatory Visit: Payer: 59 | Admitting: Internal Medicine

## 2014-05-03 MED ORDER — AMOXICILLIN-POT CLAVULANATE 500-125 MG PO TABS: 1.0000 | ORAL_TABLET | Freq: Three times a day (TID) | ORAL | Status: AC

## 2014-05-03 NOTE — Telephone Encounter (Signed)
OK. Who started this medication? She will need to stop the medication. Has she taken Augmentin before?

## 2014-05-03 NOTE — Telephone Encounter (Signed)
No reactions to Augmentin before, asked for Rx to be sent to [facility], advised to stop taking Levaquin.  Nurse stated that pt is having pitting edema in lower extremities today, pt was given Lasix at the hospital and was told to take as needed, Pt took the first one today.

## 2014-05-03 NOTE — Telephone Encounter (Signed)
Sherri, Home health nurse called stated that pt was started on Levaquin for pneumonia, since she started this medication she has been "covered in a red pimple itchy rash from head to toe"

## 2014-05-03 NOTE — Telephone Encounter (Signed)
OK. I have called in Augmentin. She will need to be seen if lower extremity swelling is persistent.

## 2014-05-12 ENCOUNTER — Ambulatory Visit: Payer: 59 | Admitting: Internal Medicine

## 2014-05-15 ENCOUNTER — Other Ambulatory Visit (HOSPITAL_COMMUNITY): Payer: Self-pay | Admitting: Internal Medicine

## 2014-05-22 LAB — HEPATIC FUNCTION PANEL A (ARMC)
ALBUMIN: 2.3 g/dL — AB (ref 3.4–5.0)
AST: 145 U/L — AB (ref 15–37)
Alkaline Phosphatase: 190 U/L — ABNORMAL HIGH
BILIRUBIN DIRECT: 0.4 mg/dL — AB (ref 0.00–0.20)
Bilirubin,Total: 0.9 mg/dL (ref 0.2–1.0)
SGPT (ALT): 16 U/L
Total Protein: 7.6 g/dL (ref 6.4–8.2)

## 2014-05-22 LAB — CBC CANCER CENTER
Basophil #: 0.2 x10 3/mm — ABNORMAL HIGH (ref 0.0–0.1)
Basophil %: 1.8 %
EOS ABS: 0.1 x10 3/mm (ref 0.0–0.7)
Eosinophil %: 1.1 %
HCT: 41.3 % (ref 35.0–47.0)
HGB: 12.8 g/dL (ref 12.0–16.0)
LYMPHS ABS: 2.2 x10 3/mm (ref 1.0–3.6)
Lymphocyte %: 20.3 %
MCH: 24.5 pg — ABNORMAL LOW (ref 26.0–34.0)
MCHC: 31.1 g/dL — AB (ref 32.0–36.0)
MCV: 79 fL — AB (ref 80–100)
MONO ABS: 0.8 x10 3/mm (ref 0.2–0.9)
Monocyte %: 7.3 %
NEUTROS PCT: 69.5 %
Neutrophil #: 7.6 x10 3/mm — ABNORMAL HIGH (ref 1.4–6.5)
PLATELETS: 591 x10 3/mm — AB (ref 150–440)
RBC: 5.23 10*6/uL — ABNORMAL HIGH (ref 3.80–5.20)
RDW: 19.7 % — ABNORMAL HIGH (ref 11.5–14.5)
WBC: 10.9 x10 3/mm (ref 3.6–11.0)

## 2014-05-22 LAB — POTASSIUM: Potassium: 4.5 mmol/L (ref 3.5–5.1)

## 2014-05-22 LAB — MAGNESIUM: Magnesium: 1.8 mg/dL

## 2014-05-22 LAB — CREATININE, SERUM
Creatinine: 1.14 mg/dL (ref 0.60–1.30)
EGFR (African American): 53 — ABNORMAL LOW
GFR CALC NON AF AMER: 45 — AB

## 2014-05-22 LAB — CALCIUM: CALCIUM: 8.8 mg/dL (ref 8.5–10.1)

## 2014-05-23 ENCOUNTER — Ambulatory Visit: Payer: Self-pay | Admitting: Internal Medicine

## 2014-06-05 LAB — COMPREHENSIVE METABOLIC PANEL
ALBUMIN: 2.4 g/dL — AB (ref 3.4–5.0)
ALK PHOS: 227 U/L — AB
Anion Gap: 8 (ref 7–16)
BILIRUBIN TOTAL: 1.4 mg/dL — AB (ref 0.2–1.0)
BUN: 32 mg/dL — ABNORMAL HIGH (ref 7–18)
CO2: 26 mmol/L (ref 21–32)
Calcium, Total: 8.7 mg/dL (ref 8.5–10.1)
Chloride: 89 mmol/L — ABNORMAL LOW (ref 98–107)
Creatinine: 1.53 mg/dL — ABNORMAL HIGH (ref 0.60–1.30)
EGFR (African American): 37 — ABNORMAL LOW
GFR CALC NON AF AMER: 32 — AB
GLUCOSE: 152 mg/dL — AB (ref 65–99)
Osmolality: 258 (ref 275–301)
Potassium: 4.9 mmol/L (ref 3.5–5.1)
SGOT(AST): 197 U/L — ABNORMAL HIGH (ref 15–37)
SGPT (ALT): 38 U/L
Sodium: 123 mmol/L — ABNORMAL LOW (ref 136–145)
Total Protein: 7 g/dL (ref 6.4–8.2)

## 2014-06-05 LAB — CANCER CENTER HEMATOCRIT: HCT: 47.3 % — ABNORMAL HIGH (ref 35.0–47.0)

## 2014-06-05 LAB — MAGNESIUM: Magnesium: 1.9 mg/dL

## 2014-06-07 ENCOUNTER — Inpatient Hospital Stay: Payer: Self-pay | Admitting: Internal Medicine

## 2014-06-07 LAB — COMPREHENSIVE METABOLIC PANEL
ALT: 34 U/L
ANION GAP: 12 (ref 7–16)
Albumin: 2.3 g/dL — ABNORMAL LOW (ref 3.4–5.0)
Alkaline Phosphatase: 189 U/L — ABNORMAL HIGH
BILIRUBIN TOTAL: 1.5 mg/dL — AB (ref 0.2–1.0)
BUN: 40 mg/dL — AB (ref 7–18)
Calcium, Total: 8.4 mg/dL — ABNORMAL LOW (ref 8.5–10.1)
Chloride: 96 mmol/L — ABNORMAL LOW (ref 98–107)
Co2: 17 mmol/L — ABNORMAL LOW (ref 21–32)
Creatinine: 1.24 mg/dL (ref 0.60–1.30)
EGFR (Non-African Amer.): 41 — ABNORMAL LOW
GFR CALC AF AMER: 48 — AB
Glucose: 142 mg/dL — ABNORMAL HIGH (ref 65–99)
Osmolality: 264 (ref 275–301)
POTASSIUM: 4.9 mmol/L (ref 3.5–5.1)
SGOT(AST): 198 U/L — ABNORMAL HIGH (ref 15–37)
Sodium: 125 mmol/L — ABNORMAL LOW (ref 136–145)
TOTAL PROTEIN: 6.6 g/dL (ref 6.4–8.2)

## 2014-06-07 LAB — PROTIME-INR
INR: 1.1 (ref 0.9–1.1)
INR: 1.1
Prothrombin Time: 13.8 secs (ref 11.5–14.7)

## 2014-06-07 LAB — CBC
HCT: 51.2 % — AB (ref 35.0–47.0)
HGB: 15.3 g/dL (ref 12.0–16.0)
MCH: 23.5 pg — AB (ref 26.0–34.0)
MCHC: 29.9 g/dL — AB (ref 32.0–36.0)
MCV: 79 fL — ABNORMAL LOW (ref 80–100)
Platelet: 691 10*3/uL — ABNORMAL HIGH (ref 150–440)
RBC: 6.53 10*6/uL — ABNORMAL HIGH (ref 3.80–5.20)
RDW: 20 % — AB (ref 11.5–14.5)
WBC: 14.9 10*3/uL — AB (ref 3.6–11.0)

## 2014-06-07 LAB — LIPASE, BLOOD: Lipase: 93 U/L (ref 73–393)

## 2014-06-07 LAB — APTT: ACTIVATED PTT: 28.1 s (ref 23.6–35.9)

## 2014-06-07 LAB — TROPONIN I

## 2014-06-07 LAB — AMMONIA

## 2014-06-08 LAB — CBC WITH DIFFERENTIAL/PLATELET
BASOS ABS: 0 10*3/uL (ref 0.0–0.1)
Basophil %: 0.1 %
EOS PCT: 0 %
Eosinophil #: 0 10*3/uL (ref 0.0–0.7)
HCT: 46.3 % (ref 35.0–47.0)
HGB: 14.9 g/dL (ref 12.0–16.0)
Lymphocyte #: 0.4 10*3/uL — ABNORMAL LOW (ref 1.0–3.6)
Lymphocyte %: 3.2 %
MCH: 24.8 pg — AB (ref 26.0–34.0)
MCHC: 32.1 g/dL (ref 32.0–36.0)
MCV: 77 fL — ABNORMAL LOW (ref 80–100)
Monocyte #: 0.1 x10 3/mm — ABNORMAL LOW (ref 0.2–0.9)
Monocyte %: 1.3 %
Neutrophil #: 10.5 10*3/uL — ABNORMAL HIGH (ref 1.4–6.5)
Neutrophil %: 95.4 %
Platelet: 612 10*3/uL — ABNORMAL HIGH (ref 150–440)
RBC: 6.02 10*6/uL — AB (ref 3.80–5.20)
RDW: 19.3 % — AB (ref 11.5–14.5)
WBC: 11 10*3/uL (ref 3.6–11.0)

## 2014-06-08 LAB — BASIC METABOLIC PANEL
ANION GAP: 12 (ref 7–16)
BUN: 47 mg/dL — AB (ref 7–18)
CO2: 21 mmol/L (ref 21–32)
Calcium, Total: 8.7 mg/dL (ref 8.5–10.1)
Chloride: 94 mmol/L — ABNORMAL LOW (ref 98–107)
Creatinine: 1.27 mg/dL (ref 0.60–1.30)
EGFR (African American): 46 — ABNORMAL LOW
EGFR (Non-African Amer.): 40 — ABNORMAL LOW
GLUCOSE: 152 mg/dL — AB (ref 65–99)
Osmolality: 270 (ref 275–301)
Potassium: 4.9 mmol/L (ref 3.5–5.1)
Sodium: 127 mmol/L — ABNORMAL LOW (ref 136–145)

## 2014-06-08 LAB — CLOSTRIDIUM DIFFICILE(ARMC)

## 2014-06-08 LAB — PROTIME-INR
INR: 1.1
Prothrombin Time: 14.1 secs (ref 11.5–14.7)

## 2014-06-09 ENCOUNTER — Telehealth: Payer: Self-pay | Admitting: Internal Medicine

## 2014-06-09 LAB — BASIC METABOLIC PANEL
Anion Gap: 13 (ref 7–16)
BUN: 29 mg/dL — ABNORMAL HIGH (ref 7–18)
CO2: 18 mmol/L — AB (ref 21–32)
CREATININE: 0.95 mg/dL (ref 0.60–1.30)
Calcium, Total: 8.5 mg/dL (ref 8.5–10.1)
Chloride: 98 mmol/L (ref 98–107)
EGFR (African American): >60
GFR CALC NON AF AMER: 57 — AB
GLUCOSE: 148 mg/dL — AB (ref 65–99)
Osmolality: 268 (ref 275–301)
POTASSIUM: 4.4 mmol/L (ref 3.5–5.1)
Sodium: 129 mmol/L — ABNORMAL LOW (ref 136–145)

## 2014-06-09 LAB — URINALYSIS, COMPLETE
Bacteria: NONE SEEN
Bilirubin,UR: NEGATIVE
Blood: NEGATIVE
Glucose,UR: NEGATIVE mg/dL (ref 0–75)
LEUKOCYTE ESTERASE: NEGATIVE
Nitrite: NEGATIVE
PH: 6 (ref 4.5–8.0)
Protein: NEGATIVE
RBC,UR: 2 /HPF (ref 0–5)
SPECIFIC GRAVITY: 1.015 (ref 1.003–1.030)
Squamous Epithelial: 2

## 2014-06-09 LAB — WBCS, STOOL

## 2014-06-09 NOTE — Telephone Encounter (Signed)
Pt was discharged from hospital for nausea and vomiting and needing a hospital follow up. Asked for discharge summary and was told given to patient and they will give to [place] when they come in.

## 2014-06-09 NOTE — Telephone Encounter (Signed)
Please call pt and schedule this appt

## 2014-06-09 NOTE — Telephone Encounter (Signed)
Need an appt within 7 business days

## 2014-06-09 NOTE — Telephone Encounter (Signed)
9/23 at 12:30 for 69min

## 2014-06-10 LAB — STOOL CULTURE

## 2014-06-10 IMAGING — CT CT ABDOMEN W/O CM
1 of 2 series · 14 of 32 positions shown, 19 images · non-contrast
Comparison: none

REASON FOR EXAM: NO ORAL CONTRAST NO IV CONTRAST Follow Up Heptocellular
cancer
COMMENTS:

[Series 2: abd 3mm wo 3.0 i40f 3 · axial · 0.89mm/px · z∈[+85,+337]mm · 14 of 94 slices shown, 19 images]
[im 5/94  soft-tissue]
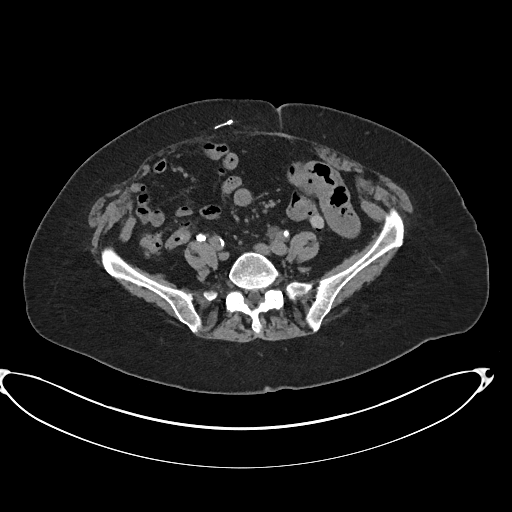
[im 5/94  bone]
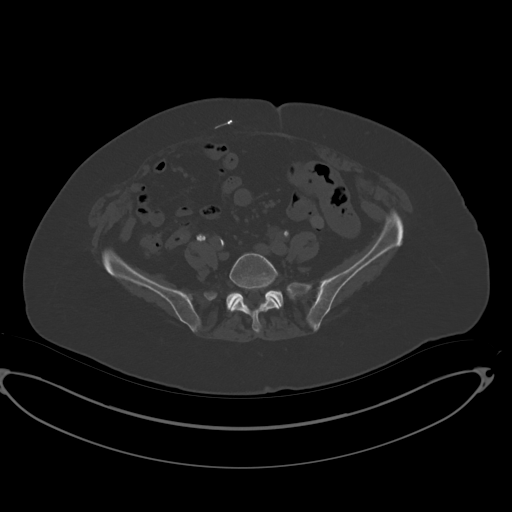
[im 14/94  soft-tissue]
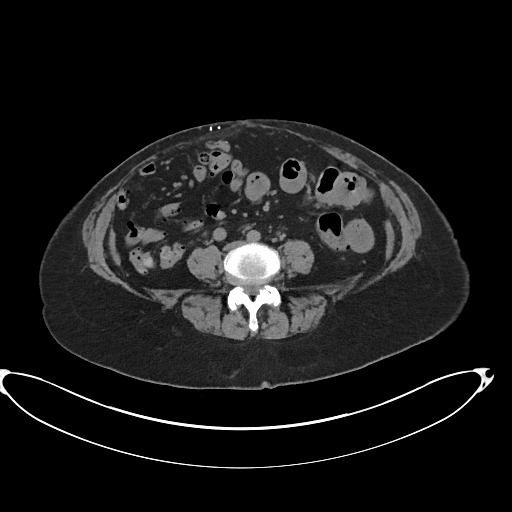
[im 19/94  soft-tissue]
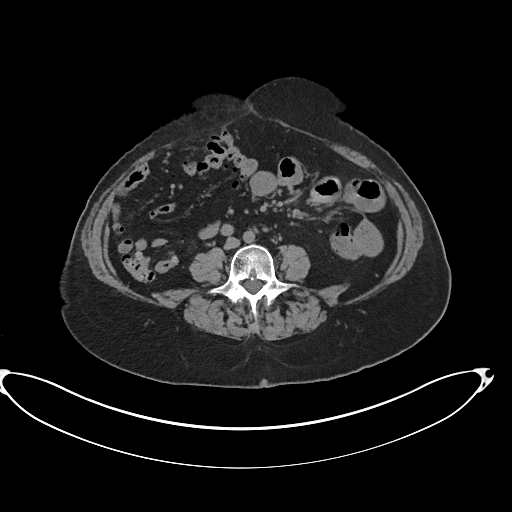
[im 28/94  soft-tissue]
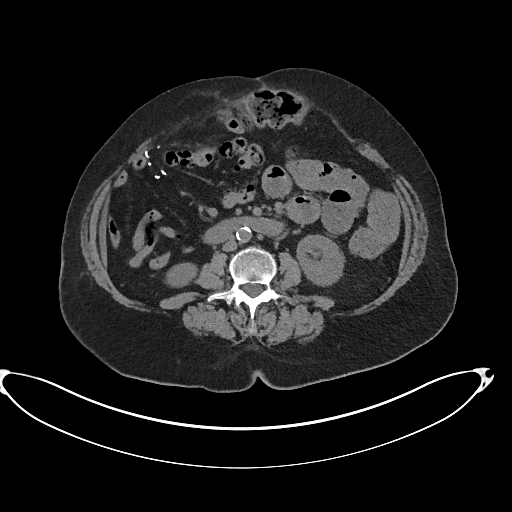
[im 33/94  soft-tissue]
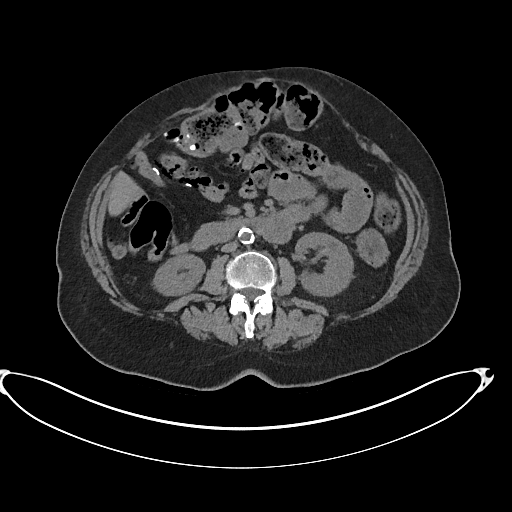
[im 42/94  soft-tissue]
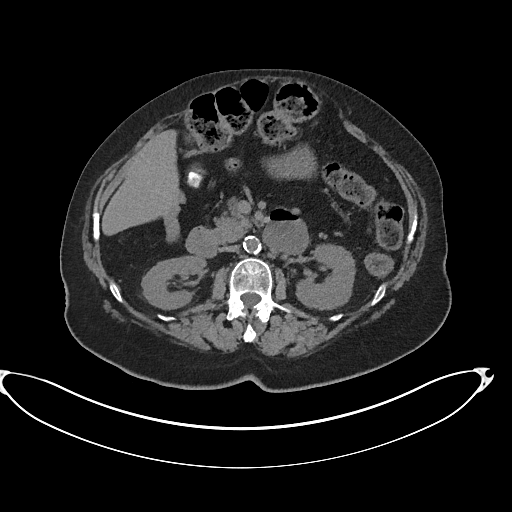
[im 47/94  soft-tissue]
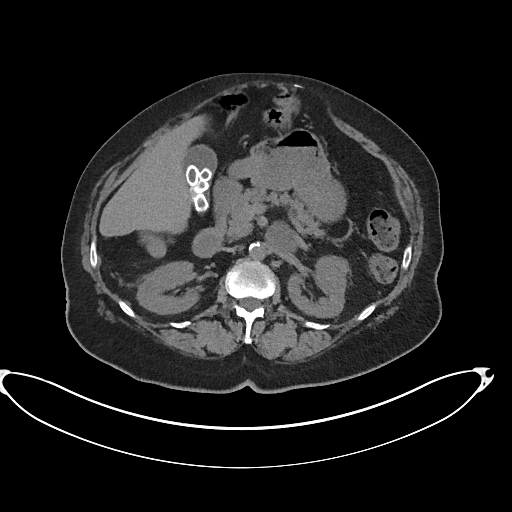
[im 52/94  soft-tissue]
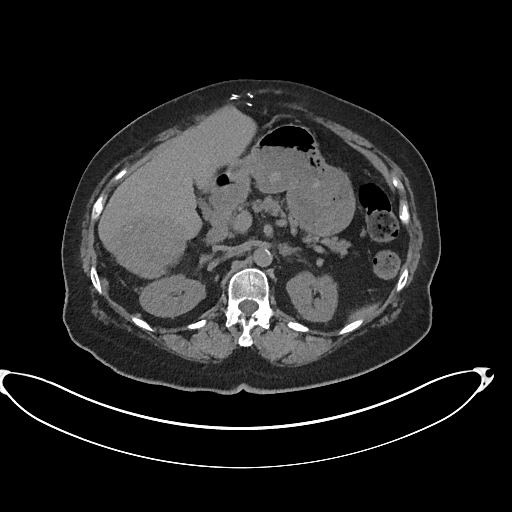
[im 61/94  soft-tissue]
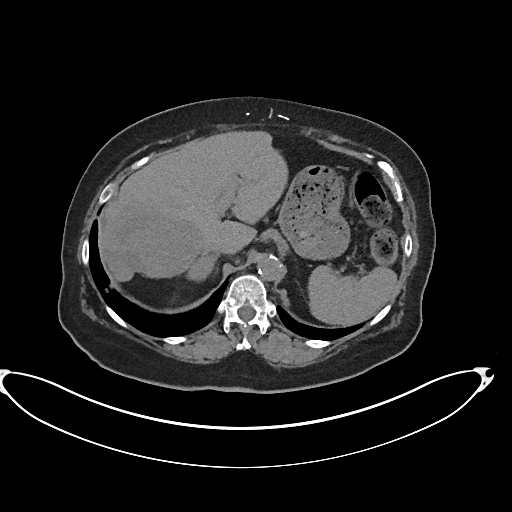
[im 61/94  bone]
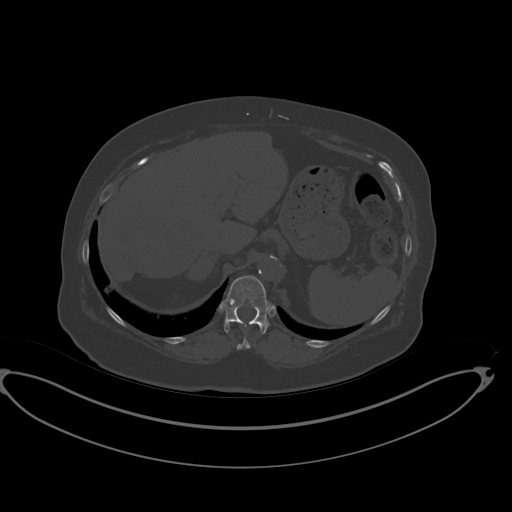
[im 66/94  soft-tissue]
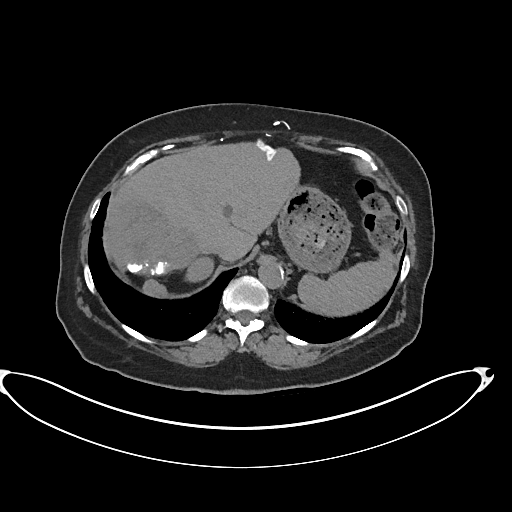
[im 75/94  soft-tissue]
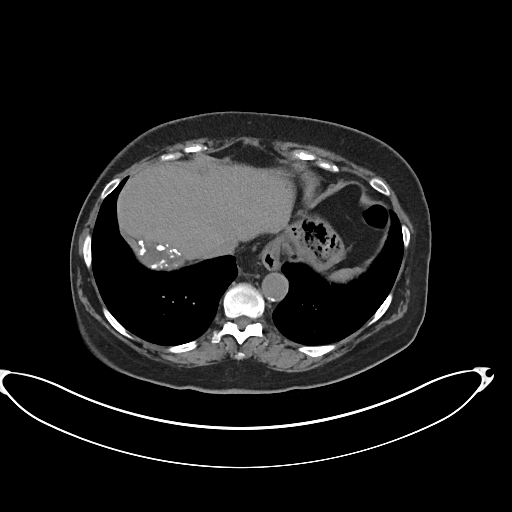
[im 75/94  lung]
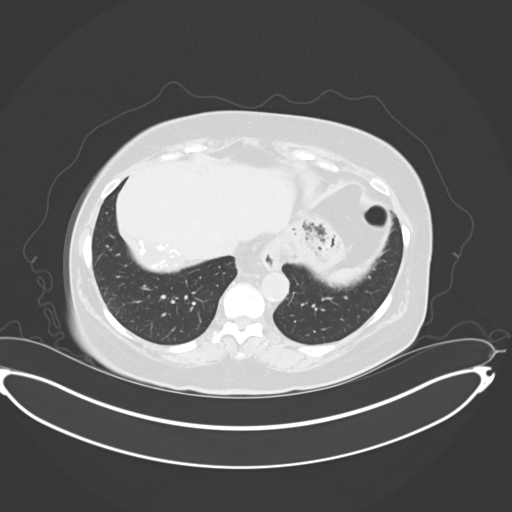
[im 80/94  soft-tissue]
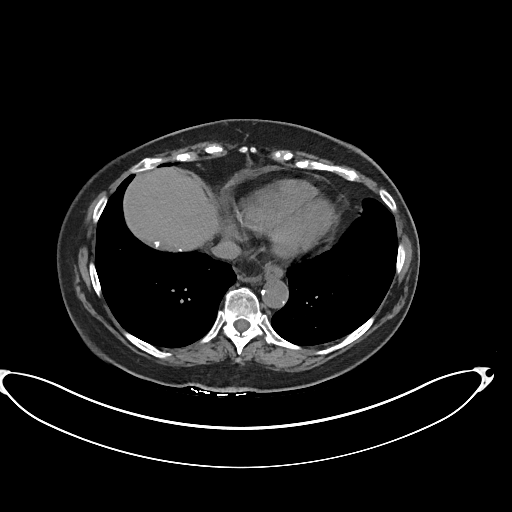
[im 80/94  lung]
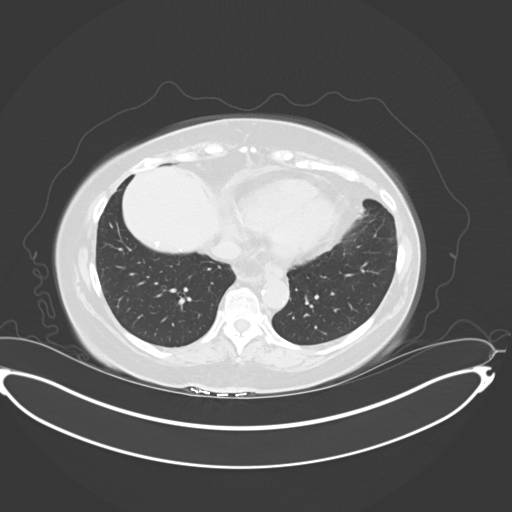
[im 84/94  lung]
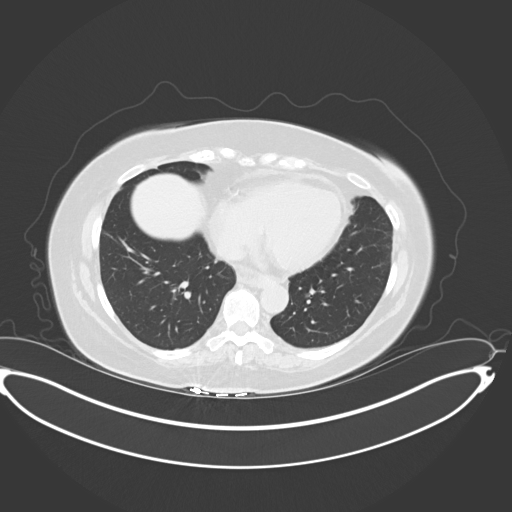
[im 89/94  soft-tissue]
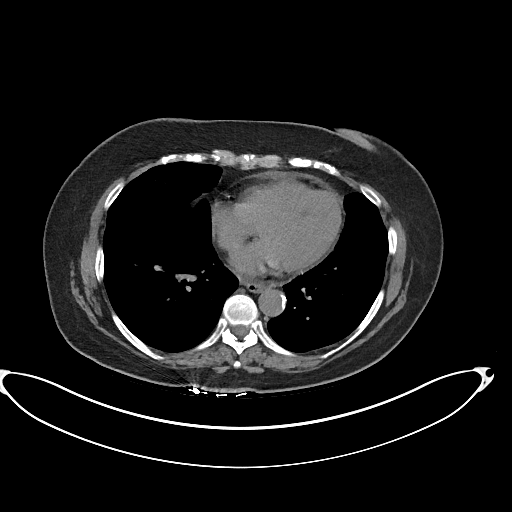
[im 89/94  lung]
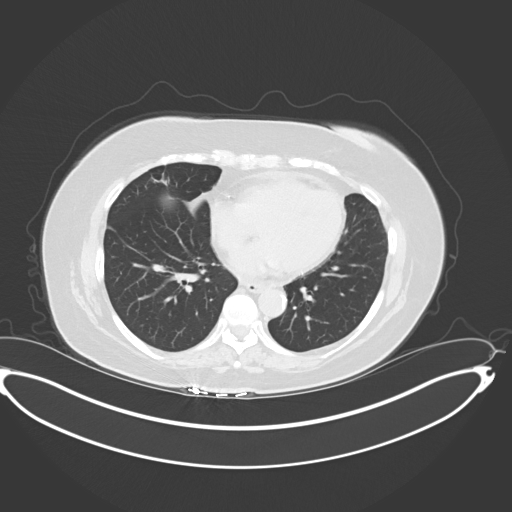

[14 of 32 positions shown; findings below may reference images not displayed]

PROCEDURE:     KCT - KCT ABDOMEN STANDARD WO  - June 23, 2013 [DATE]

RESULT:     Axial noncontrast CT scanning was performed through the abdomen
with reconstructions at 3 mm intervals and slice thicknesses. Review of
multiplanar reconstructed images was performed separately on the VIA monitor.

There is a large area of decreased density with associated calcifications in
the posterior aspect of the right lobe liver. The calcifications are new
since the previous study. There is radiodensity along the anterior capsule
of the left lobe of the liver which is stable. There are multiple gallstones
present. The stomach, spleen, pancreas, and left adrenal gland are normal in
appearance. There is a right adrenal mass which has developed since the
previous study. It measures 3.9 x 2 cm. There is a parenchymal nodule
posterior to the right lobe of the liver that abuts the dome of the
diaphragm that is worrisome for neoplasm impacted on the peritoneal surface.
The kidneys exhibit no evidence of stones or obstruction. The caliber of the
abdominal aorta is normal. There is marked thinning of the anterior
abdominal wall musculature likely from previous surgeries. The unopacified
loops of small and large bowel appear normal where visualized. The lung
bases are clear.
IMPRESSION: 1. There is chronic abnormality of the right hepatic lobe consistent with
known malignancy. A radiodensity associated with the anterior-inferior
aspect of the capsule of the left lobe of the liver is present and stable.
2. There is a new soft tissue nodule abutting the posterior peritoneal
surface at the level of the dome of the diaphragm on the right worrisome for
progressive malignancy. There is a new right adrenal mass.
2. There are multiple gallstones present.
3. The observed portions of the bowel exhibit no acute abnormalities.
4. There is no splenomegaly.
5. The lung bases exhibit no abnormal nodules.

[REDACTED]

## 2014-06-12 LAB — CULTURE, BLOOD (SINGLE)

## 2014-06-12 NOTE — Telephone Encounter (Signed)
Please disregard request,  I contacted the pt

## 2014-06-12 NOTE — Telephone Encounter (Signed)
Spoke with pt husband, he states that pt's health has declined and she is bedridden, hospice has taken over her care.

## 2014-06-22 ENCOUNTER — Ambulatory Visit: Payer: Self-pay | Admitting: Internal Medicine

## 2014-07-20 ENCOUNTER — Telehealth: Payer: Self-pay

## 2014-07-20 NOTE — Telephone Encounter (Signed)
Chelsea Howard, Can we change her status to deceased in her chart? Can we get notes from [facility] to see what happened?

## 2014-07-20 NOTE — Telephone Encounter (Signed)
Called to speak with pt however, was informed that pt had passed.

## 2014-07-21 NOTE — Telephone Encounter (Signed)
Changed status and requested notes from [facility] [place]

## 2014-07-23 DEATH — deceased

## 2014-07-24 NOTE — Telephone Encounter (Signed)
Changed status and placed hospital records in folder for your review

## 2014-08-22 ENCOUNTER — Ambulatory Visit: Payer: Self-pay | Admitting: Family Medicine

## 2015-01-13 NOTE — H&P (Signed)
PATIENT NAME:  Chelsea Howard, Chelsea Howard MR#:  [id number] DATE OF BIRTH:  [birth year]  DATE OF ADMISSION:  04/24/2014  REFERRING PHYSICIAN: Yetta Numbers. Karma Greaser, MD  PRIMARY CARE PHYSICIAN: Eduard Clos. Gilford Rile, MD  ONCOLOGIST: Simonne Come. Gittin, MD  CHIEF COMPLAINT: Weakness.   HISTORY OF PRESENT ILLNESS: An 81 year old Caucasian female with a history of hepatocellular carcinoma with known lung metastases, followed by Dr. Inez Pilgrim, presenting with nausea, vomiting, and weakness. She describes 1-day duration of nausea, vomiting, and nonbloody, nonbilious emesis, multiple bouts, with generalized weakness. She also describes a 2-day duration of cough which was dry as well as subjective fevers and chills. Denies any further symptomatology. Denies chest pains or actual shortness of breath. The family at bedside states that she appears more short of breath than usual, and when pressed for questioning, states that she has noticed some wheezing lately. On arrival to the ER, she required supplemental oxygen to keep oxygen saturation greater than 92%.   REVIEW OF SYSTEMS: CONSTITUTIONAL: Positive for fevers, chills, fatigue, weakness.  EYES: No blurry vision, double vision, or eye pain.  EARS, NOSE, THROAT: Denies tinnitus, ear pain, hearing loss.  RESPIRATORY: Positive for cough, wheeze, shortness of breath as described above.  CARDIOVASCULAR: Denies chest pain, palpitations, edema.  GASTROINTESTINAL: Positive for nausea, vomiting. Denies diarrhea, abdominal pain.  GENITOURINARY: Denies dysuria, hematuria.  ENDOCRINE: Denies nocturia, thyroid problems. HEMATOLOGY AND LYMPHATIC: Denies easy bruising, bleeding. SKIN: Denies rashes, lesions.  MUSCULOSKELETAL: Denies pain in neck, back, shoulder, knees, hips, or arthritic symptoms.  NEUROLOGIC: Denies paralysis, paresthesias. PSYCHIATRIC: Denies anxiety or depressive symptoms.  Otherwise, full review of systems performed by me is negative.   PAST MEDICAL HISTORY:  Hypertension, hyperlipidemia, hepatocellular carcinoma with known lung metastases.   SOCIAL HISTORY: Denies any alcohol, tobacco, or drug usage.   FAMILY HISTORY: Positive for coronary artery disease, late onset.   ALLERGIES: No known drug allergies.   HOME MEDICATIONS: Include aspirin 81 mg p.o. q. daily, oxycodone 5 mg p.o. b.i.d. as needed for pain, quinapril 10 mg p.o. b.i.d., amlodipine 10 mg p.o. at bedtime, Lasix 20 mg 2 times a week on Monday and Thursday as needed, lactulose 15 mL b.i.d.   PHYSICAL EXAMINATION:  VITAL SIGNS: Temperature 99.7, heart rate 118, respirations 24, blood pressure 108/64, saturating 94% on 2 liters nasal cannula, weight 77.6 kg, BMI 30.3.  GENERAL: Weak-appearing Caucasian female, currently in no acute distress.  HEAD: Normocephalic, atraumatic.  EYES: Pupils equal, round, reactive to light. Extraocular movements intact. No scleral icterus.  MOUTH: Moist mucous membranes. Dentition intact. No abscess noted. EARS, NOSE, THROAT:no abscess.  No external lesions.  NECK: Supple. No thyromegaly. No nodules. No JVD.  PULMONARY: Diminished breath sounds throughout all lung fields secondary to effort. However, no frank wheezes, rales, rhonchi. Poor respiratory effort.  CHEST: Nontender to palpation.  CARDIOVASCULAR: S1, S2, regular rate and rhythm. No murmurs, rubs, or gallops. No edema. Pedal pulses 2+ bilaterally. GASTROINTESTINAL: Soft, nontender, nondistended. No masses. Positive bowel sounds. No hepatosplenomegaly.  MUSCULOSKELETAL: No swelling, clubbing, or edema. Range of motion full in all extremities.  NEUROLOGIC: Cranial nerves II through XII intact. No gross focal neurological deficits.sensation and reflexes intact  SKIN: No ulceration, lesions, rash, cyanosis. Skin warm, dry. Turgor intact.  PSYCHIATRIC: Mood, affect within normal limits. Patient awake, alert, oriented x 3. Insight and judgment intact.   LABORATORY DATA: Chest x-ray performed revealing  right upper lobe infiltrate. Remainder of laboratory data: Sodium 129, potassium 3.7, chloride 96, bicarbonate 22, BUN 11, creatinine  1, glucose 157. Magnesium 1.1. LFTs: Albumin 2.2, bilirubin 4.8, alkaline phosphatase 447, AST 246, ALT 128. Troponin less than 0.02. WBC 22.2, which is neutrophil predominant. Hemoglobin 13.6, platelets of 527,000. INR 1.2. Urinalysis negative for evidence of infection.   ASSESSMENT AND PLAN: An 81 year old Caucasian female with a history of hepatocellular carcinoma with known lung metastases presenting with weakness, found to have right upper lobe infiltrate on x-ray.  1.  Sepsis. Meeting septic criteria by heart rate, respiratory rate, and leukocytosis present on arrival secondary to right upper lobe community-acquired pneumonia. Broad antibiotic coverage given her history of cancer and recently finishing chemotherapy1-2 weeks. Get pan culture including blood and sputum. Antibiotic coverage, received vancomycin, Zosyn, Levaquin in the Emergency Department. Will continue these and taper when culture data returns. Provide intravenous fluid hydration to keep mean arterial pressure greater than 65. DuoNeb treatments q. 4 hours. Supplemental oxygen to keep oxygen saturation greater than 92% as well as incentive spirometry.  2.  Hyponatremia. Intravenous fluid with normal saline. Follow sodium levels.  3.  Lactic acidosis. Intravenous fluid hydration. Keep mean arterial pressure greater than 65.  4.  Hypomagnesemia. Replace, with a magnesium goal of 2. 5.  Hypertension. Hold p.o. medications given relative hypotension.  6.  Hepatocellular carcinoma. Follow with Dr. Inez Pilgrim.  7.  Venous thromboembolism prophylaxis with heparin subcutaneous.  CODE STATUS: Patient full code.  TIME SPENT: 45 minutes    ____________________________ Aaron Mose. Hower, MD dkh:[id number] D: 04/24/2014 20:41:34 ET T: 04/24/2014 22:55:06 ET JOB#: [id number]  cc: Aaron Mose. Hower, MD, <Dictator> DAVID Woodfin Ganja  MD ELECTRONICALLY SIGNED 04/26/2014 0:17

## 2015-01-13 NOTE — Consult Note (Signed)
Brief Consult Note: Diagnosis: PROGRESIVE HEPATOCELLULAR CANCER, DEHYDRATION, NAUSEA, HYPONATRESMIA, COLITIS.   Patient was seen by consultant.   Comments: PATIENT WAS SEEN AND EVALUATED ON 9/17 IN AFTERNOON. FAMILY MEMBER AT BEDSIDE. Marland KitchenTHIS NOTATION AND DICTATED CONSULT NOT PLACED UNTIL 9/18..AGREED WITH D/C TO HOSPICE. CURRENTLY PALLIATIVE CARE. PATIENT TO DECIDE IF SHE WANTS A TRIAL OF IMMUNE [place], IN WHICH CASE SHE WOULD WITHDRAW FROM HOSPICE...  Electronic Signatures: Dallas Schimke (MD)  (Signed 18-Sep-15 17:39)  Authored: Brief Consult Note   Last Updated: 18-Sep-15 17:39 by Dallas Schimke (MD)

## 2015-01-13 NOTE — Consult Note (Signed)
PATIENT NAME:  Chelsea Howard, Chelsea Howard MR#:  [id number] DATE OF BIRTH:  1932-02-08  DATE OF CONSULTATION:  06/08/2014  REFERRING PHYSICIAN:   CONSULTING PHYSICIAN:  Cheral Marker. Ola Spurr, MD  REFERRING PHYSICIAN: Dr. Leslye Peer.   REASON FOR CONSULTATION: Rash.   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 She was admitted September 16 with nausea and vomiting for several days. She has also had decreased urine output. Several weeks ago she was admitted for pneumonia and sepsis and discharged home on Levaquin. Apparently she has also over the last 4-5 days developed a rash on her hands and feet and also on her back. She has not been having any fevers or chills. She has not been having night sweats. Her last chemotherapy was several weeks ago. She did recently have a CT scan which showed colitis and possible increase in size of the liver cancer and adrenal metastases.   PAST MEDICAL HISTORY:  1. Liver cancer in 2013 metastatic to adrenal pulmonary following with Dr. Inez Pilgrim.  2. Mild dementia.  3. Immobility.  4. Hypertension.   PAST SURGICAL HISTORY:  1.  Hernia repair.  2.  Ruptured cyst in the liver in the 1970s.   ALLERGIES: POTENTIALLY ALLERGIC TO LEVAQUIN.   SOCIAL HISTORY: No smoking, alcohol, or drugs. Lives with her husband. Used to work as a Careers adviser.    FAMILY HISTORY: Positive for father dying of an MI and mother with colon cancer.    REVIEW OF SYSTEMS: 11 systems reviewed and negative except as per HPI.    MEDICATIONS: Include aspirin, dexamethasone, lactulose, Reglan, Zofran, oxycodone, quinapril. Antibiotics since admission include metronidazole.   PHYSICAL EXAMINATION:  VITAL SIGNS: Temperature 98.4, pulse 94, blood pressure 183/83, respirations 19, saturation 92% on room air.  GENERAL: She is thin, in no acute distress.  HEENT: Pupils equal, round, reactive to light and  accommodation. Extraocular movements are intact. Sclerae are anicteric.  OROPHARYNX: Clear. There is no thrush.  NECK: Supple.  HEART: Regular.  LUNGS: Clear.  ABDOMEN: Soft, slightly distended, mildly tender to palpation diffusely.  EXTREMITIES: No clubbing, cyanosis or edema.  SKIN: On her hands bilaterally she has symmetrical erythematous macules that are nonblanching. These are nontender. She has some fewer ones on her soles. On her left intertriginous area she has an impressive candidal infection.  NEUROLOGIC:  She is alert, interactive, nonfocal neurologic exam.   DATA: White blood count on admission was 14.9, hemoglobin 15.3, platelets 691,000. LFTs show an elevated AST at 198, ALT of 34, alkaline phosphatase 189, total bilirubin 1.5, albumin 2.3, ammonia level was less than 10. Lipase was normal. Renal function shows a creatinine of 1.24. Blood cultures x 2 are negative. Clostridium difficile is negative. Stool culture is pending as negative. INR is normal at 1.1.    IMAGING: CT of her abdomen and pelvis showed enlarging nodules within the right and left lower lobe of the lungs, largest 1.4 x 0.5 and the other 1.9 x 1.1. Interval increase in size in the right hepatic lobe hepatocellular carcinoma. There is also a tumor thrombus in the right portal vein, spleen and pancreas are unremarkable, there is wall thickening and multiple loops of small bowel within the right lower quadrant suggestive of enteritis, there is also fluid in the right paracolic gutter with layering, high attenuation material compatible with blood products. Suggestion of thickening of the descending colon which may represent colitis, there is cholelithiasis with  associated gallbladder distention.   IMPRESSION: An 81 year old female with history of metastatic liver cancer with metastases to the lung who was admitted with nausea, vomiting, and elevated white count. She is apparently  not on chemotherapy currently. She also has  an impressive rash on her palms and somewhat on her soles. She also has evidence of candidal intertriginitis on her left flank.   Differential for this palmar eruption is  her right shin is relatively broad, but I would favor an id  reaction at this point to the fungal infection on her abdomen.  Other options would include drug eruption, parvovirus B19, coxsackievirus, secondary syphilis, vasculitis. She is hepatitis B negative.   RECOMMENDATIONS:  1.  I would treat the candida intertriginous infection topically at this point, given her liver disease and her increased LFTs I would not suggest fluconazole.   2.  Check an RPR.  3.  No specific antibiotic treatment would be recommended at this point.  4.  If the rash does not improve I would recommend dermatology consultation, but this can likely be done as an outpatient if she remains stable.  5.  Thank you for the consult. I will be glad to follow with you.      ____________________________ Cheral Marker. Ola Spurr, MD dpf:bu D: 06/08/2014 17:05:33 ET T: 06/08/2014 18:43:31 ET JOB#: [id number]  cc: Cheral Marker. Ola Spurr, MD, <Dictator> Rawson Minix Ola Spurr MD ELECTRONICALLY SIGNED 07/10/2014 23:52

## 2015-01-13 NOTE — Discharge Summary (Signed)
PATIENT NAME:  Chelsea Howard, Chelsea Howard MR#:  [id number] DATE OF BIRTH:  May 20, 1932  DATE OF ADMISSION:  06/07/2014 DATE OF DISCHARGE:  06/09/2014  PRIMARY CARE PHYSICIAN:  Anderson Malta A. Gilford Rile, MD    CONSULTATIONS: Palliative care physician, Dr. Ermalinda Memos, and ID, Dr. Ola Spurr.    DISCHARGE DIAGNOSES: Colitis, hyponatremia, acute renal failure with dehydration, accelerated hypertension, metastatic liver cancer to lungs and adrenal glands, dementia.   CONDITION: Poor.   CODE STATUS: DNR.   HOME MEDICATIONS: Please refer to the medication reconciliation list.   DIET: Low-sodium diet.   ACTIVITY: As tolerated.   FOLLOWUP CARE: Follow with PCP and dermatology for rash within 1 to 2 weeks.   REASON FOR ADMISSION: Nausea, vomiting.   HOSPITAL COURSE: The patient is an 81 years old Caucasian female with a history of liver cancer with metastasis was sent to the ED due to nausea, vomiting for a few days. The patient was treated for pneumonia 3 to 4 weeks ago and discharged home with Levaquin. The patient's CAT scan of the abdomen showed possible colitis with increasing size of liver cancer and  adrenal metastasis. For detailed history and physical examination, please refer to the admission note dictated by Dr. Leslye Peer. On admission date, the patient's laboratory data showed troponin negative, BUN 40, creatinine  1.24. Electrolytes were normal.  1.  The patient was admitted for colitis and need to rule out C. difficile colitis. The patient was initially treated with Rocephin and Flagyl, however, the patient's stool test is negative C. difficile so Flagyl was discontinued. The patient will be given Cipro p.o. b.i.d. for 10 days.  2.  Rash on the hands and the soles and the back. Dr. Ola Spurr evaluated patient. He suggested the patient needs to follow up with dermatologist as outpatient.  3.  Acute renal failure with dehydration and hyponatremia. After admission, the patient has been treated with normal  saline, IV fluid support. Acute renal failure improved. Creatinine decreased from 1.53 to 0.95, BUN decreased from 47 to 29, and sodium increased from 123 to 129. Palliative care staff evaluated the patient, suggested hospice care at home. The patient's symptoms are better.  She will be discharged to home with hospice care today. Discussed the patient's discharge plan with the patient, nurse, palliative care staff, Remo Lipps, and the case manager.   TIME SPENT: About 38 minutes.   ____________________________ Demetrios Loll, MD qc:AT D: 06/09/2014 16:47:01 ET T: 06/10/2014 00:02:04 ET JOB#: [id number]  cc: Demetrios Loll, MD, <Dictator> Demetrios Loll MD ELECTRONICALLY SIGNED 06/10/2014 16:08

## 2015-01-13 NOTE — Discharge Summary (Signed)
PATIENT NAME:  Chelsea Howard, SCOTTO MR#:  [id number] DATE OF BIRTH:  Oct 26, 1931  DATE OF ADMISSION:  04/24/2014 DATE OF DISCHARGE:  04/27/2014  ADMITTING PHYSICIAN: Aaron Mose. Hower, MD.  DISCHARGING PHYSICIAN: Gladstone Lighter, MD.   PRIMARY ONCOLOGIST: Barbette Reichmann, MD.   PRIMARY CARE PHYSICIAN: Eduard Clos. Gilford Rile, [place]: Oncology consultation with Dr. Inez Pilgrim.   DISCHARGE DIAGNOSES: 1.  Sepsis with Escherichia coli bacteremia.  2.  Right upper lobe pneumonia.  3.  Hypertension.  4.  Acute respiratory failure requiring oxygen here in the hospital.  5.  Hepatocellular carcinoma with lung metastasis.   6.  Elevated transaminases secondary to liver cancer.   DISCHARGE MEDICATIONS:  1. Lactulose 15 p.o. b.i.d.  2. Oxycodone 5 mg p.o. b.i.d. p.r.n. for pain.  3.  Aspirin 81 mg p.o. daily.  4.  Lasix 20 mg p.o. twice a week on Mondays and Thursday as needed for swelling.  5.  Norvasc 5 mg p.o. daily.  6.  Levaquin 500 mg p.o. daily for 11 days.   DISCHARGE DIET: Low-sodium diet.   DISCHARGE ACTIVITY: As tolerated.    FOLLOWUP INSTRUCTIONS: 1.  PCP followup in followup in 1 week. 2.  Follow up with Dr. Inez Pilgrim as per scheduled.   LABORATORIES AND IMAGING STUDIES PRIOR TO DISCHARGE: Sodium 135, potassium 3.4, chloride 102, bicarbonate 24, BUN 5, creatinine 0.7, glucose 93 and calcium of 8.0.   WBC 17.1, hemoglobin 12.3, hematocrit 40.3, platelet count 417,000. Chest x-ray prior to discharge showing multiple lung metastases and an ill-defined area of density in his right lung, which is better visualized as a masslike appearance. Possible small left pleural effusion.   BRIEF HOSPITAL COURSE: Ms. Chelsea Howard is an 81 year old Caucasian female with past medical history significant for hypertension and liver cancer with lung metastases,  presented to the hospital secondary to generalized weakness, nausea, vomiting and noted to have possible right upper lobe  pneumonia.   ASSESSMENT:  1.  Sepsis secondary to right upper lobe pneumonia. However, blood cultures were positive with Escherichia coli. The patient was on broad-spectrum antibiotics, especially her receiving chemotherapy with her cancer, and sepsis with elevated white count of almost 30,000 on admission. Blood cultures came back as Escherichia coli based on sensitivities. She is being changed over to Levaquin. Urine did not show any source of infection. One of the cultured bottles was also streptococcus viridans which was deemed to be a contaminant because it was negative in the other 3 bottles but they were growing E.coli.  2. Hypertension. Her medications were held because she was hypotensive when she came in. Her home medications are being changed, decreased dose of Norvasc to 5 mg at the time of discharge.  3.  Hepatocellular carcinoma with lung metastases. Follows with Dr. Inez Pilgrim. Seen by Dr. Inez Pilgrim in the hospital and has an outpatient followup scheduled.   4.  Acute respiratory failure secondary to pneumonia and also lung metastases on admission improved with treatment and currently not requiring any oxygen. Her saturations on exertion were 94% today on room air, so she is being discharged. Her course has been otherwise uneventful in the hospital.   DISCHARGE CONDITION: Stable.   DISCHARGE DISPOSITION: Home.   TIME SPENT ON DISCHARGE: 40 minutes.    ____________________________ Gladstone Lighter, MD rk:at D: 04/27/2014 12:50:42 ET T: 04/27/2014 14:39:11 ET JOB#: [id number]  cc: Gladstone Lighter, MD, <Dictator> Eduard Clos. Gilford Rile, MD Simonne Come Inez Pilgrim, MD Gladstone Lighter MD ELECTRONICALLY SIGNED 05/09/2014 14:09

## 2015-01-13 NOTE — Consult Note (Signed)
PATIENT NAME:  Chelsea Howard, Chelsea Howard MR#:  [id number] DATE OF BIRTH:  01-Apr-1932  DATE OF CONSULTATION:  06/08/2014  CONSULTING PHYSICIAN:  Simonne Come. Inez Pilgrim, MD  HISTORY OF PRESENT ILLNESS: Chelsea Howard is an 81 year old patient, well known to me. She was admitted on 09/16. I saw and evaluated her on 09/17. This narrative is delayed until the current time. Evaluation was performed the bedside on 09/17 with a family member in attendance. The patient, who was admitted on the 16th with nausea and vomiting, was given intravenous fluids and treated for possible colitis with Flagyl and Rocephin. Infectious disease was consulted for a rash, which was felt not to be due to sepsis or [facility] spotted fever or any particular etiology. The patient was treated with topical treatment for intertriginous Candida of the skin. Fluids support was continued.   CT scan was done on admission that identified possible colitis and possible intra-abdominal fluid or blood. The patient was treated for possible sepsis. There was no evidence of either perforation or intra-abdominal catastrophe. The patient did require medication for pain. I saw her and she was alert, cooperative, had been medicated for pain earlier and so was not in pain when I saw her. She was not vomiting. Had mild nausea. Had had a bowel movement, but it was not diarrhea. Denied any headache or dizziness or visual disturbance. Was not having chills or sweats. No cough. No shortness of breath. No retrosternal chest pain or palpitations. Was not having abdominal pain at that time. Still had nausea. Was not vomiting. No dysuria or hematuria. No back or bone pain. No focal weakness. No pruritus.   PAST MEDICAL HISTORY: With hepatocellular cancer, prior on Nexavar, recently stopped for progression, known metastasis to lungs and adrenal glands. Underlying history includes dementia as well immobility, hypertension and hernia surgery.   ALLERGIES: LEVAQUIN RECENTLY  DEVELOPED A RASH AFTER A COURSE OF LEVAQUIN.  MEDICATIONS: At time of admission: Aspirin 81 daily, Decadron 2 mg daily, lactulose twice a day p.r.n., Reglan 10 mg 3 times a day, Zofran 4 mg every 8 hours, oxycodone 5 mg b.i.d., quinapril 10 mg twice a day.   SOCIAL HISTORY: No alcohol or tobacco.   FAMILY HISTORY: Noncontributory. Mother had colon cancer.   PHYSICAL EXAMINATION: GENERAL: Was afebrile when seen and was alert and cooperative.  HEENT: Sclerae no jaundice.  MOUTH: No thrush.  LYMPHATICS: No palpable lymph nodes in the neck, cervix or axilla.  LUNGS: Clear with no wheezing or rales.  ABDOMEN: Slightly distended, but bowel sounds were normoactive.  HEART: Regular.  EXTREMITIES: There was no extremity edema. There was no calf tenderness.  NEUROLOGIC: Grossly nonfocal. Alert and cooperative.  PSYCHIATRIC: Mood and affect unremarkable and at baseline.   LABORATORY DATA: On admission: The creatinine was 1.24. The calcium was 8.4. The bilirubin was 1.5. The AST was 198. The albumin was 2.3. The platelets were 691,000. The hemoglobin was 15.3. The white count was 14.9.   DIAGNOSTIC DATA: A CT of the abdomen and pelvis had already been described above, showing intra-abdominal findings, also a suggestion of tumor thrombus of the portal and hepatic veins and increase in adrenal and pulmonary metastasis. EKG showed a sinus tachycardia.   IMPRESSION: The patient admitted and being treated for hyponatremia, dehydration, possible colitis, medicated for pain and nausea. Treated topically for an intertriginous fungal rash. Rash in the hands and feet was considered nonspecific. An infectious disease consult was done. The patient with effects attributed to progression of known  cancer. No acute treatments or new issues from oncology. No active therapy at this point.   PLAN: The patient is embracing comfort and palliative care and wants to go home with hospice. I discussed this at length with the  patient and family. Agree that this is the appropriate course at this time; however, we discuss the options of further therapy and patient would still be a candidate for targeted kinase therapy as a next-line of treatment after Nexavar with some potential for improvement, but with the side effect profile and the patient already having gastroinestinal symptoms and declining health, I think it would be difficult. The patient is weighing the potential benefits including prolongation of life against quality of life and does not want to proceed in that direction at this time. Another option is immunotherapy and Nivolumab being potentially effective and also the potential for some cases to have a more long-lasting effect. That patient and family are still going to consider if she will return to the clinic after a brief rest, but if she would be interested in salvage therapy that would involve withdrawing from hospice services for potential life-prolonging therapy. For now, wants to get home as soon as possible, continue medical treatment and re-evaluate her shortly after discharge for potential salvage therapy.    ____________________________ Simonne Come. Inez Pilgrim, MD rgg:TT D: 06/11/2014 16:26:08 ET T: 06/11/2014 18:10:30 ET JOB#: [id number]  cc: Simonne Come. Inez Pilgrim, MD, <Dictator> Dallas Schimke MD ELECTRONICALLY SIGNED 06/20/2014 14:16

## 2015-01-13 NOTE — Consult Note (Signed)
PATIENT NAME:  Chelsea Howard, Chelsea Howard MR#:  [id number] DATE OF BIRTH:  1932-07-15  DATE OF CONSULTATION:  06/08/2014  REFERRING PHYSICIAN:   CONSULTING PHYSICIAN:  Simonne Come. Romaldo Saville, MD  HISTORY OF PRESENT ILLNESS: The patient is an 81 year old patient, well known to me, who was admitted on 09/16. She is followed by me in the cancer center and was seen as recently as 08/31. I saw and evaluated this patient on 09/17 and also met with family members at the bedside, although this narrative is delayed until the current day. The patient, who is followed with known progressive hepatocellular cancer and who was being considered for salvage therapies, had had nausea, intermittent vomiting, weakness, dehydration and had been in the clinic for IV fluids this week and on 09/16 was hospitalized with nausea and vomiting. She had a CT that showed possible colitis and some thickening of multiple small bowel loops, also the possibility of some intra-abdominal blood, but there is no evidence of perforated viscus or intra-abdominal catastrophe.   The patient had hyponatremia secondary to dehydration and has a baseline hypertension. Liver function tests are elevated, attributed to cancer. She was managed initially with IV fluids and antiemetics and considered role of hospice. She was started on Rocephin and Flagyl and C. difficile was checked though that turned out to be negative. There was a rash on the palms and soles of unclear origin. The patient did have a consultation with infectious disease, who felt that there was no evidence of either [facility] spotted fever or systemic illness contributing to the rash and intertriginous Candida was to be treated with topical treatments.   PAST MEDICAL HISTORY: Included liver cancer in diagnosed back in 2013, for which the patient had been on Nexavar previously until recently discontinuing for evidence of progressive disease, metastatic disease includes to the adrenal glands and  lungs. Has a history of dementia, well compensated and relatively really stable recently. Also hypertension and there have been hernia surgeries in the past.   ALLERGIES: LEVAQUIN; RECENTLY THE PATIENT WITH A RASH FROM [place].   MEDICATIONS: Has been on 81 mg aspirin, Decadron 2 mg daily that was given for appetite, lactulose twice a day or p.r.n., Reglan 10 mg 3 times a day given recently for nausea, Zofran 4 mg every 8 hours p.r.n., oxycodone 5 mg b.i.d. p.r.n. for abdominal pain and quinapril 10 mg twice a day.   SOCIAL HISTORY: No alcohol, no tobacco.   FAMILY HISTORY: Included colon cancer in the family. Otherwise, noncontributory.   ADDITIONAL SYSTEM REVIEW: When I saw the patient, she had general weakness. She was no longer vomiting. She had had some abdominal pain. She had been medicated so was comfortable when I saw her, but later in the day had had abdominal pain. Had passed material per rectum, but was not diarrhea. Abdomen felt slightly bloated and distended. Did not have fever, chills or sweats. Was not coughing, was not short of breath. Denied headache. Denied dizziness. Denied visual disturbances or sinus pain or pressure. Denied dysuria or hematuria. Denied back, bone or joint pain. Denied pruritus, but had rash. Was not overly anxious or depressed. There was no extremity edema.   PHYSICAL EXAMINATION: GENERAL: Was alert and cooperative, was afebrile.  VITAL SIGNS: Were stable.  HEENT: Sclerae clear. Mouth: No thrush.  NECK: There were no palpable lymph nodes in the neck, supraclavicular, submandibular or axilla.  HEART: Regular.  ABDOMEN: Was not currently tender, but had been medicated. Did not feel any abdominal  mass. The abdomen was slightly tympanitic, but there were normal bowel sounds.  EXTREMITIES: There was no extremity edema. There was no calf tenderness.  NEUROLOGIC: There was no gross focal weakness. The patient was alert, cooperative and oriented.   LABORATORY  DATA: On admission the creatinine is 1.24, the glucose was 142, potassium was 4.9, total bilirubin was 1.5, alkaline phosphatase was 189, AST was 198, albumin was 2.3. White count 14.9, hemoglobin 15.3, platelets 691,000. Ammonia level was less than 10.  DIAGNOSTIC DATA: The CT of the abdomen and pelvis that had already been noted and showed some fluid in the right pericolic gutter, possibly compatible with blood. Multiple dilated loops of small bowel and possible ischemic enteritis or colitis. Also suggestion of tumor thrombus in portal and hepatic veins. Also, the adrenal metastasis and bony metastasis had increased.   PLAN: The patient was given intravenous fluids and antibiotics. Infectious disease was consulted. C. difficile was ruled out. Not anticoagulating. I discussed also with the family at the bedside. The patient and family, with the downhill course recently and poor prognosis with lung disease, are comfortable with palliative care and endorsed hospice services. We did discuss the option of palliative or salvage therapies, with 1 option of another oral kinase inhibitor with potential for modest improvement, but also for side effects plus the patient already nauseous and vomiting and has declining health, there might be limited benefit. The other best option is immunotherapy with Nivolumab. Discussed with the patient and family the potential side effects with that treatment, the potential tolerability, the potential for approximately 30% response rate, sometime responses could be enduring. The patient and family were currently agreeable to consider that if the patient makes any improvement or remains stable and is willing and able to return to the clinic for additional therapy, then in the short term, hospice could be withdrawn for an attempt at life-prolonging therapy. Currently to continue treatment for colitis by medicine. Continue fluid replacement. Plan for discharge as soon as possible and  re-evaluate quickly either back in the clinic or at home with a hospice visit.     ____________________________ Simonne Come. Inez Pilgrim, MD rgg:TT D: 06/11/2014 16:17:00 ET T: 06/11/2014 17:52:42 ET JOB#: [id number]  cc: Simonne Come. Inez Pilgrim, MD, <Dictator> Dallas Schimke MD ELECTRONICALLY SIGNED 06/20/2014 14:16

## 2015-01-13 NOTE — H&P (Signed)
PATIENT NAME:  Chelsea Howard, BLUMENTHAL MR#:  [id number] DATE OF BIRTH:  [birth year]  DATE OF ADMISSION:  06/07/2014  PRIMARY CARE PHYSICIAN:  Ronette Deter, MD   ONCOLOGIST:  Simonne Come. Gittin, MD   CHIEF COMPLAINT: Nausea and vomiting.   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 She presents with nausea, vomiting going on for a few days now. She received fluids 2 times via Dr. Remi Deter' office. She is dehydrated, decreased urination. Three to 4 weeks ago, she was in the hospital for pneumonia and sepsis and discharged home on [facility]. Family states that she had a rash with that.  She has noticed a rash on the hands and feet and on her back, petechial in nature. No fever, unable to keep anything down. Hospitalist services were contacted for further evaluation when a CT scan showed possible colitis, increasing size of the liver cancer and adrenal metastases, wall thickening of multiple small bowel loops and development of fluid within the right paracolic gutter layering with high attenuation compatible with blood products.   PAST MEDICAL HISTORY: Diagnosed with liver cancer in 2013, metastatic to adrenals and pulmonary, received chemotherapy in the past by Dr. Inez Pilgrim; dementia, immobility, hypertension.   PAST SURGICAL HISTORY: Hernia, intestinal surgery. In 1970s, had a surgery on the liver wear a ruptured cyst or something of that nature.   ALLERGIES: TO POTENTIALLY LEVAQUIN.    MEDICATIONS: As per Prescription Writer includes aspirin 81 mg daily, dexamethasone 2 mg daily, lactulose 10 grams 15 mL twice a day, Reglan 10 mg 3 times a day, Zofran 4 mg every 8 hours as needed for nausea, vomiting, oxycodone 5 mg twice a day as needed for pain, quinapril 10 mg twice a day.   SOCIAL HISTORY: No smoking. No alcohol. No drug use. Lives with husband. 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   FAMILY HISTORY: Father  died at 31 of an MI. Mother died of colon cancer.   REVIEW OF SYSTEMS:   CONSTITUTIONAL: Positive for weakness. No fever, chills, or sweats. No weight loss. No weight gain.  EYES: No problems with vision.  EARS, NOSE, MOUTH, AND THROAT: Decreased hearing. No sore throat. No difficulty swallowing.  CARDIOVASCULAR: No chest pain. No palpitations.  RESPIRATORY: No shortness of breath. Positive for cough. No sputum. No hemoptysis.  GASTROINTESTINAL: Positive for nausea, vomiting. No abdominal pain, abdominal distention. No diarrhea recently, but did have diarrhea after discharge from the hospital.  GENITOURINARY: Decreased urination. No hematuria.  MUSCULOSKELETAL: No joint pain or muscle pain.  INTEGUMENT: Rashes on the palms and soles, arms, legs, and back.  NEUROLOGIC: No fainting or blackouts.  PSYCHIATRIC: No anxiety or depression.  ENDOCRINE: No thyroid problems.  HEMATOLOGIC AND LYMPHATIC:  No anemia, no easy bruising or bleeding.   PHYSICAL EXAMINATION: VITAL SIGNS: Temperature 97.5, pulse 105, respirations 18, blood pressure 161/64, pulse oximetry 98% on room air.  GENERAL: No respiratory distress.  EYES: Conjunctivae and lids normal. Pupils equal, round, and reactive to light. Extraocular muscles intact. No nystagmus.  EARS, NOSE, MOUTH, AND THROAT: Tympanic membranes: No erythema. Nasal mucosa: No erythema. Throat: No erythema, no exudate seen. Lips and gums: No lesions.  NECK: No JVD. No bruits. No lymphadenopathy. No thyromegaly. No thyroid nodules palpated.  LUNGS: Clear to auscultation. No use of accessory muscles to breathe. No rhonchi, rales, or wheeze heard.  CARDIOVASCULAR: S1, S2 normal. No gallops, rubs, or murmurs  heard. Carotid upstroke 2+ bilaterally. No bruits. Dorsalis pedis pulses 2+ bilaterally.  ABDOMEN: Soft, nontender, difficult to palpate hepatomegaly secondary to the patient's positioning. Normal active bowel sounds.  EXTREMITIES: No clubbing, edema, or  cyanosis.  SKIN: Petechial, nonblanching areas over the palms, soles, back, into the groin, and slightly on the buttock area.  NEUROLOGIC: Cranial nerves II through XII grossly intact. Deep tendon reflexes 1+ bilateral lower extremity.  Brudzinski sign negative. Kernig sign negative.  PSYCHIATRIC: The patient is alert, oriented to person and place.   LABORATORY AND RADIOLOGICAL DATA: Troponin negative. Glucose 142, BUN 40, creatinine 1.24, sodium 125, potassium 4.9, chloride 96, CO2 of 17, calcium 8.4, total bilirubin 1.5, alkaline phosphatase 189, ALT 34, AST 198, albumin 2.3. Lipase 93. White blood cell count 14.9, H and H 15.3 and 51.2, platelet count of 691,000. Ammonia level less than 10. CT scan of the abdomen and pelvis showed interval development of fluid within the right paracolic gutter, layering attenuation with material compatible with blood products, likely originates from the large known hepatic mass, wall thickening and multiple small bowel loops within the right lower quadrant suggestive of enteritis including infectious, inflammatory or ischemic, suggestion of thickening of the descending colon, which may represent colitis, interval increase in the size of primary right hepatic lobe hepatocellular carcinoma. Suggestion of tumor thrombus within the portal and hepatic veins, interval increase in the size of the right adrenal metastases, increase in size and visualized pulmonary metastases, cholelithiasis with associated gallbladder distention. EKG shows sinus tachycardia, left axis deviation, Q waves inferiorly and anterolaterally.   ASSESSMENT AND PLAN: 1.  Nausea and vomiting: Could be secondary to colitis and multiple small bowel loops. Could also be secondary to blood in the abdomen from the hepatic mass. We will give supportive care at this point in time with antibiotics, Rocephin and Flagyl. We will send off stool studies. Could possibly be stool for Clostridium difficile because of the  recent antibiotics with the sepsis last month. We will continue to monitor.  2.  Rash on the palms and soles and back:  Does not seem to be a meningococcus or [facility] spotted fever without any fever, but I will get an infectious disease consult. I will check a PT, INR, and PTT since this is not done. Because the patient has liver metastases, could have something to do with the coagulation, could also have something to be done with the blood in the abdomen. Empiric antibiotics with high-dose Rocephin. We will put in isolation for right now since I am not sure what the etiology of this is at this point.  3.  Hyponatremia. This is secondary to dehydration. We will hydrate with normal saline and potassium.  4.  Metastatic liver cancer to be lungs and adrenal glands:  All lesions on this CT scan are getting larger. Overall prognosis is poor. The patient made a DO NOT RESUSCITATE. I will give a palliative care consultation.  This patient will likely be a candidate for hospice.  5.  Dementia.  6.  Hypertension. We will allow blood pressure to be a little high at this point.  7.  Elevated liver function tests secondary to liver cancer.  8.  Malnutrition secondary to the liver cancer.    TIME SPENT ON ADMISSION: Sixty minutes.  The patient is a DO NOT RESUSCITATE.    ____________________________ Tana Conch. Leslye Peer, MD [id number] D: 06/07/2014 20:29:40 ET T: 06/07/2014 20:40:44 ET JOB#: [id number]  cc: Tana Conch. Leslye Peer, MD, <Dictator> Anderson Malta  Raina Mina, MD Simonne Come. Inez Pilgrim, MD  Marisue Brooklyn MD ELECTRONICALLY SIGNED 06/15/2014 13:57

## 2015-01-13 NOTE — Consult Note (Signed)
Brief Consult Note: Diagnosis: SEPSIS   HEPATOCELLULAR CANCER.   Patient was seen by consultant.   Comments: PATIENT SEEN EARLY EVENING 8/5.  CHART REVIEWED. WELL KNOWN TO ME.  NOTHING ACUTELY FROM HEME/ONC. I DID NOT PERFORM FULL EXAM, NOTHING TO ADD WITH ONCOLOGY CONSULT. PATIENT WITH DOCUMENTED RECENT PROGRESSIVE DISEASE, WILL F/U T O SEE ME ON 8/24.    SUGG..F/U BLOOD CULTURES, I SUSPECT GM POS ROD WILL BE CONTAMINANT, I DO NOT SEE HOW GM NEG RODS AND GM POS COCCI CAN BE FOUND SIMULTANEOUSLY.  PLEASE RECONSULT IF ANY HEME ONC ISSUES.  Electronic Signatures: Dallas Schimke (MD)  (Signed 06-Aug-15 00:14)  Authored: Brief Consult Note   Last Updated: 06-Aug-15 00:14 by Dallas Schimke (MD)

## 2015-04-14 IMAGING — CR DG CHEST 2V
1 series · 2 of 2 positions shown · non-contrast
Comparison: 04/24/2014 and 06/13/2013

CLINICAL DATA: Hypoxia.  Pneumonia.

EXAM:
CHEST  2 VIEW

[Series 1: x chest ap · 0.14mm/px · 2 of 2 slices shown]
[im 1/2]
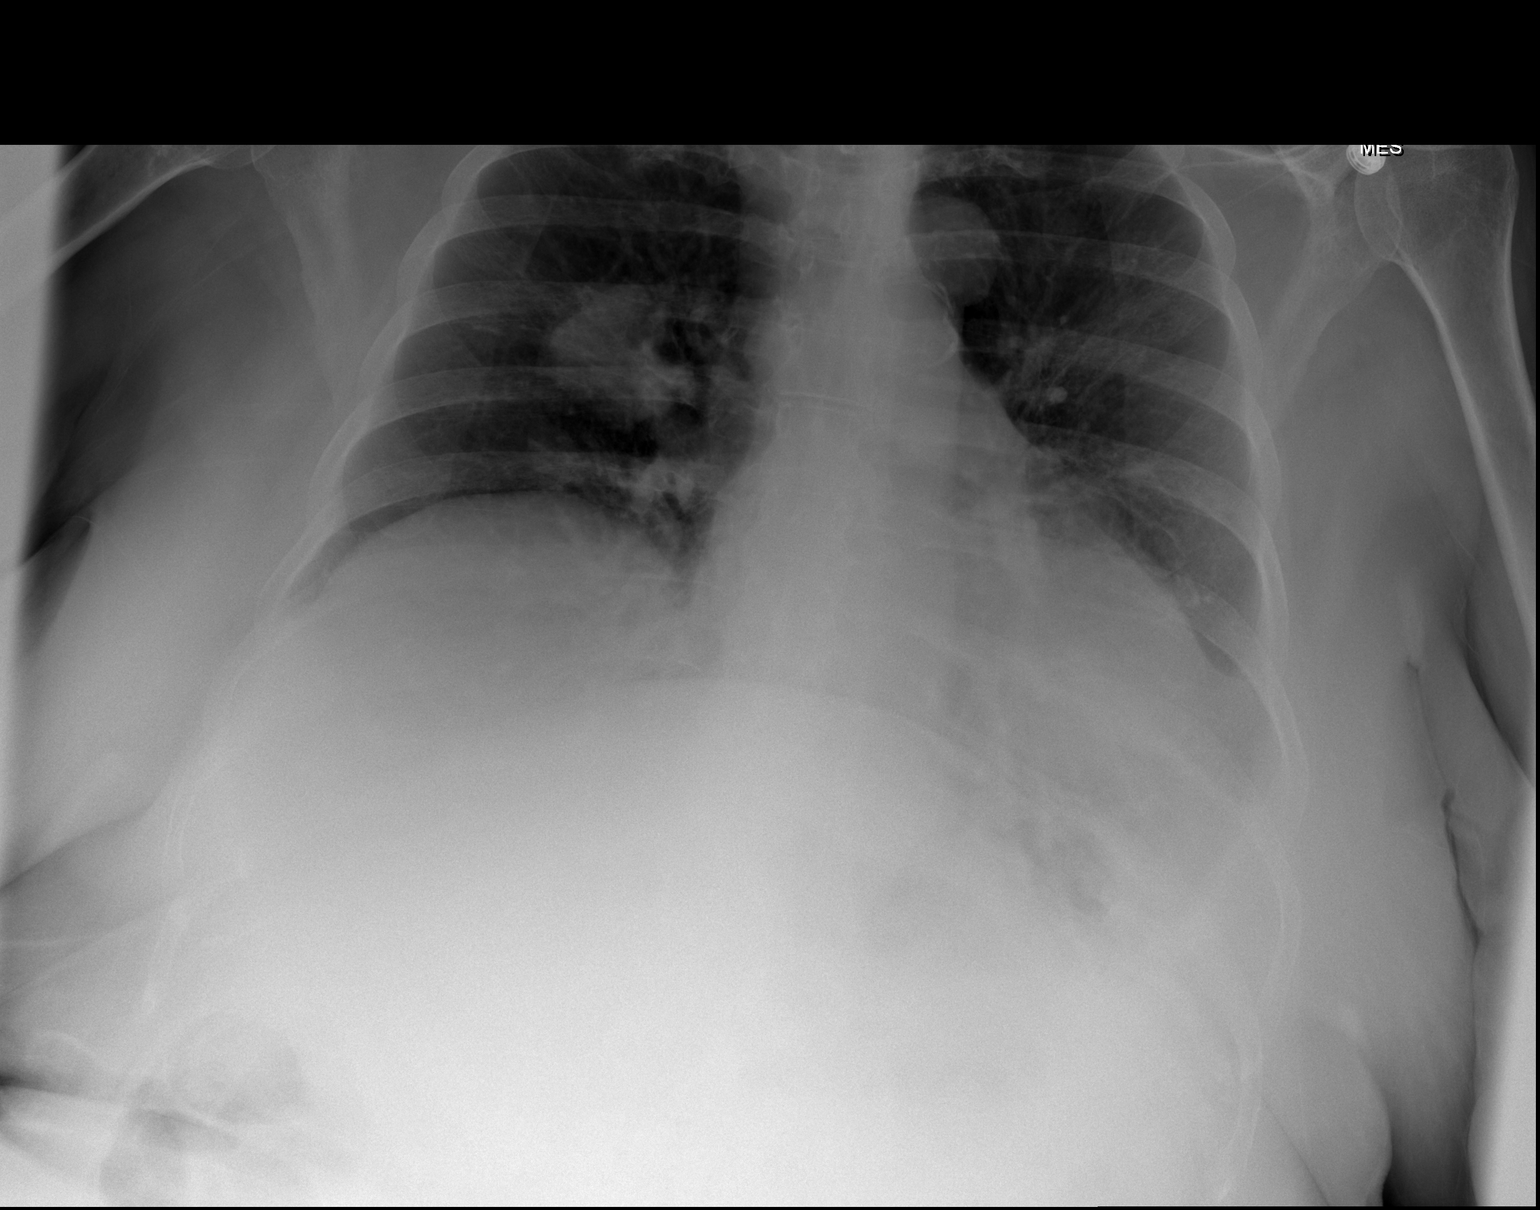
[im 2/2]
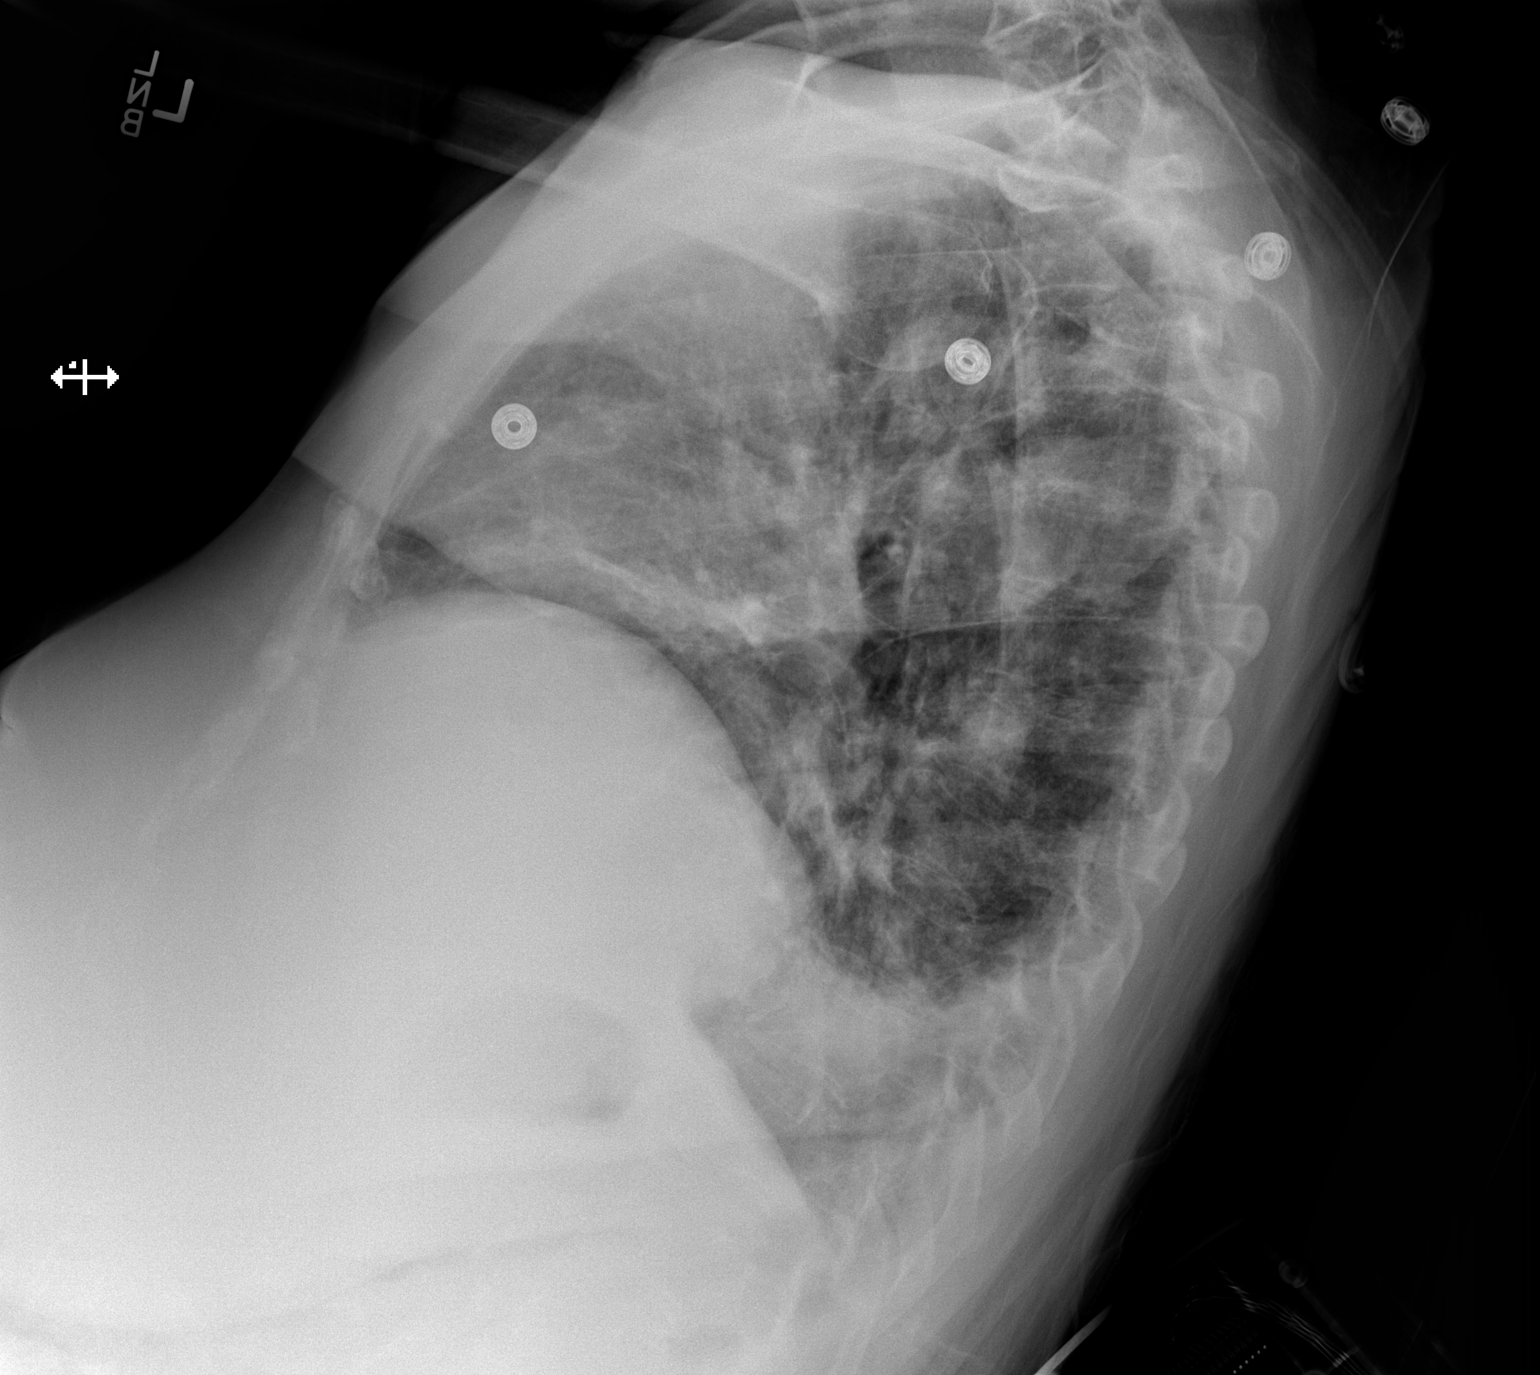

[2 of 2 positions shown; findings below may reference images not displayed]

FINDINGS: The ill-defined area of density in the right lung is better
visualized on the current exam and has a masslike appearance. In
addition, there is a 3.2 cm mass in the left upper lobe adjacent to
the top of the aortic arch. There is also an 8 mm nodule overlying
the anterior aspect left second rib. There is a small right pleural
effusion. Probable small left effusion.
IMPRESSION: Multiple lung masses worrisome for metastatic disease.
